# Patient Record
Sex: Male | Born: 1970 | Race: White | Hispanic: No | Smoking: Never smoker
Health system: Southern US, Community
[De-identification: ages and names within clinical notes are randomized; demographics above are authoritative.]

## PROBLEM LIST (undated history)

## (undated) DIAGNOSIS — M109 Gout, unspecified: Secondary | ICD-10-CM

## (undated) DIAGNOSIS — F419 Anxiety disorder, unspecified: Secondary | ICD-10-CM

## (undated) DIAGNOSIS — R7303 Prediabetes: Secondary | ICD-10-CM

## (undated) DIAGNOSIS — I10 Essential (primary) hypertension: Secondary | ICD-10-CM

## (undated) DIAGNOSIS — E790 Hyperuricemia without signs of inflammatory arthritis and tophaceous disease: Secondary | ICD-10-CM

## (undated) HISTORY — DX: Prediabetes: R73.03

## (undated) HISTORY — DX: Essential (primary) hypertension: I10

## (undated) HISTORY — PX: KNEE ARTHROCENTESIS: SUR44

---
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
## 2017-04-20 ENCOUNTER — Encounter: Payer: Self-pay | Admitting: Emergency Medicine

## 2017-04-20 ENCOUNTER — Emergency Department
Admission: EM | Admit: 2017-04-20 | Discharge: 2017-04-20 | Disposition: A | Discharge status: Home / Self Care | Payer: Commercial Managed Care - PPO | Source: Home / Self Care | Attending: Family Medicine | Admitting: Family Medicine

## 2017-04-20 ENCOUNTER — Other Ambulatory Visit: Payer: Self-pay

## 2017-04-20 DIAGNOSIS — I10 Essential (primary) hypertension: Secondary | ICD-10-CM

## 2017-04-20 HISTORY — DX: Gout, unspecified: M10.9

## 2017-04-20 HISTORY — DX: Anxiety disorder, unspecified: F41.9

## 2017-04-20 LAB — POCT CBC W AUTO DIFF (K'VILLE URGENT CARE)

## 2017-04-20 MED ORDER — LISINOPRIL 10 MG PO TABS: 10.0000 mg | ORAL_TABLET | Freq: Every day | ORAL | 0 refills | Status: DC

## 2017-04-20 NOTE — ED Triage Notes (Signed)
Patient had GI infection 2 days ago with nausea, vomiting and diarrhea; he was seen in an Urgent Care and at that visit was told his blood pressure was high; given xanax. Today at work BP was also high.

## 2017-04-20 NOTE — ED Provider Notes (Signed)
[facility]    CSN: [id number] Arrival date & time: 04/20/17  1718     History   Chief Complaint Chief Complaint  Patient presents with  . Hypertension    HPI Leon Henson is a 47 y.o. male.   HPI Leon Henson is a 47 y.o. male presenting to UC with concern for elevated blood pressure since having GI symptoms of n/v/d 2 days ago.  He was seen at a different urgent care at that time and was advised his BP was very high- 160/110.  He does have a hx of anxiety and was given Xanax and pt and provider were unable to determine if elevated BP was new or due to anxiety.  His GI symptoms have resolved so he did not take his xanax today and went to work. While at work, he reported his new prescription of Xanax to on-site nurse who also checked his BP which was 160/100.  Was advised to be evaluated for his elevated blood pressure. Denies HA, chest pain, dizziness, change in vision. No prior hx of HTN but he also does not have a PCP and is unsure of his last annual physical. Hx of HTN in his family.    Past Medical History:  Diagnosis Date  . Anxiety   . Gout     There are no active problems to display for this patient.   Past Surgical History:  Procedure Laterality Date  . KNEE ARTHROCENTESIS Left        Home Medications    Prior to Admission medications   Medication Sig Start Date End Date Taking? Authorizing Provider  citalopram (CELEXA) 10 MG tablet Take 10 mg by mouth daily.   Yes [provider]  febuxostat (ULORIC) 40 MG tablet Take 80 mg by mouth daily.   Yes [provider]  lisinopril (PRINIVIL,ZESTRIL) 10 MG tablet Take 1 tablet (10 mg total) by mouth daily. 04/20/17   Lurene Shadow, PA-C    Family History Family History  Problem Relation Age of Onset  . Heart failure Mother   . Hypertension Father     Social History Social History   Tobacco Use  . Smoking status: Never Smoker  . Smokeless tobacco: Never Used  Substance Use Topics   . Alcohol use: Yes  . Drug use: Not on file     Allergies   Patient has no known allergies.   Review of Systems Review of Systems  Constitutional: Negative for chills and fever.  Respiratory: Negative for cough and shortness of breath.   Cardiovascular: Negative for chest pain and palpitations.  Gastrointestinal: Negative for abdominal pain, diarrhea, nausea and vomiting.  Skin: Negative for rash.  Neurological: Negative for dizziness, light-headedness and headaches.     Physical Exam Triage Vital Signs ED Triage Vitals  Enc Vitals Group     BP --      Pulse Rate 04/20/17 1746 95     Resp 04/20/17 1746 16     Temp 04/20/17 1746 97.9 F (36.6 C)     Temp Source 04/20/17 1746 Oral     SpO2 04/20/17 1746 96 %     Weight 04/20/17 1750 246 lb (111.6 kg)     Height 04/20/17 1750 6' (1.829 m)     Head Circumference --      Peak Flow --      Pain Score --      Pain Loc --      Pain Edu? --  Excl. in GC? --    No data found.  Updated Vital Signs Pulse 95   Temp 97.9 F (36.6 C) (Oral)   Resp 16   Ht 6' (1.829 m)   Wt 246 lb (111.6 kg)   SpO2 96%   BMI 33.36 kg/m   Visual Acuity Right Eye Distance:   Left Eye Distance:   Bilateral Distance:    Right Eye Near:   Left Eye Near:    Bilateral Near:     Physical Exam  Constitutional: He is oriented to person, place, and time. He appears well-developed and well-nourished. No distress.  HENT:  Head: Normocephalic and atraumatic.  Mouth/Throat: Oropharynx is clear and moist.  Eyes: Conjunctivae and EOM are normal. Pupils are equal, round, and reactive to light. No scleral icterus.  Neck: Normal range of motion. Neck supple.  Cardiovascular: Normal rate and regular rhythm.  Pulmonary/Chest: Effort normal and breath sounds normal. No stridor. No respiratory distress. He has no wheezes. He has no rales.  Musculoskeletal: Normal range of motion.  Neurological: He is alert and oriented to person, place, and  time. No cranial nerve deficit.  Skin: Skin is warm and dry. He is not diaphoretic.  Psychiatric: He has a normal mood and affect. His behavior is normal.  Nursing note and vitals reviewed.    UC Treatments / Results  Labs (all labs ordered are listed, but only abnormal results are displayed) Labs Reviewed  COMPLETE METABOLIC PANEL WITH GFR - Abnormal; Notable for the following components:      Result Value   Glucose, Bld 113 (*)    AST 67 (*)    ALT 67 (*)    All other components within normal limits  POCT CBC W AUTO DIFF ([facility])    EKG  EKG Interpretation None       Radiology No results found.  Procedures Procedures (including critical care time)  Medications Ordered in UC Medications - No data to display   Initial Impression / Assessment and Plan / UC Course  I have reviewed the triage vital signs and the nursing notes.  Pertinent labs & imaging results that were available during my care of the patient were reviewed by me and considered in my medical decision making (see chart for details).     Asymptomatic HTN Pt does not have a PCP but wanting to establish care as soon as he can. Will start on lisinopril 10mg  and get CBC and CMP Resource guide for PCP next door provided  Discussed symptoms that warrant emergent care in the ED.   Final Clinical Impressions(s) / UC Diagnoses   Final diagnoses:  Hypertension, unspecified type    ED Discharge Orders        Ordered    lisinopril (PRINIVIL,ZESTRIL) 10 MG tablet  Daily     04/20/17 1757       Controlled Substance Prescriptions [place] Controlled Substance Registry consulted? Not Applicable   Rolla Platehelps, Kalil Woessner O, PA-C 04/22/17 [id number]

## 2017-04-21 ENCOUNTER — Telehealth: Payer: Self-pay | Admitting: Emergency Medicine

## 2017-04-21 LAB — COMPLETE METABOLIC PANEL WITH GFR
AG Ratio: 1.6 (calc) (ref 1.0–2.5)
ALT: 67 U/L — ABNORMAL HIGH (ref 9–46)
AST: 67 U/L — ABNORMAL HIGH (ref 10–40)
Albumin: 4.6 g/dL (ref 3.6–5.1)
Alkaline phosphatase (APISO): 64 U/L (ref 40–115)
BUN: 15 mg/dL (ref 7–25)
CO2: 28 mmol/L (ref 20–32)
Calcium: 9.9 mg/dL (ref 8.6–10.3)
Chloride: 98 mmol/L (ref 98–110)
Creat: 1.18 mg/dL (ref 0.60–1.35)
GFR, Est African American: 85 mL/min/{1.73_m2} (ref >=60)
GFR, Est Non African American: 73 mL/min/{1.73_m2} (ref >=60)
Globulin: 2.8 g/dL (calc) (ref 1.9–3.7)
Glucose, Bld: 113 mg/dL — ABNORMAL HIGH (ref 65–99)
Potassium: 4.3 mmol/L (ref 3.5–5.3)
Sodium: 137 mmol/L (ref 135–146)
Total Bilirubin: 0.8 mg/dL (ref 0.2–1.2)
Total Protein: 7.4 g/dL (ref 6.1–8.1)

## 2017-04-21 NOTE — Telephone Encounter (Signed)
Unable to leave a message.

## 2017-06-09 ENCOUNTER — Encounter (INDEPENDENT_AMBULATORY_CARE_PROVIDER_SITE_OTHER): Payer: Self-pay

## 2017-06-09 ENCOUNTER — Encounter: Payer: Self-pay | Admitting: Physician Assistant

## 2017-06-09 ENCOUNTER — Ambulatory Visit: Payer: Commercial Managed Care - PPO | Admitting: Physician Assistant

## 2017-06-09 VITALS — BP 174/119 | HR 80 | Wt 246.0 lb

## 2017-06-09 DIAGNOSIS — R740 Nonspecific elevation of levels of transaminase and lactic acid dehydrogenase [LDH]: Secondary | ICD-10-CM

## 2017-06-09 DIAGNOSIS — Z8249 Family history of ischemic heart disease and other diseases of the circulatory system: Secondary | ICD-10-CM | POA: Diagnosis not present

## 2017-06-09 DIAGNOSIS — F411 Generalized anxiety disorder: Secondary | ICD-10-CM

## 2017-06-09 DIAGNOSIS — Z7689 Persons encountering health services in other specified circumstances: Secondary | ICD-10-CM

## 2017-06-09 DIAGNOSIS — E781 Pure hyperglyceridemia: Secondary | ICD-10-CM | POA: Diagnosis not present

## 2017-06-09 DIAGNOSIS — R7401 Elevation of levels of liver transaminase levels: Secondary | ICD-10-CM

## 2017-06-09 DIAGNOSIS — Z8739 Personal history of other diseases of the musculoskeletal system and connective tissue: Secondary | ICD-10-CM | POA: Diagnosis not present

## 2017-06-09 DIAGNOSIS — Z13220 Encounter for screening for lipoid disorders: Secondary | ICD-10-CM | POA: Diagnosis not present

## 2017-06-09 DIAGNOSIS — I10 Essential (primary) hypertension: Secondary | ICD-10-CM | POA: Diagnosis not present

## 2017-06-09 MED ORDER — LISINOPRIL-HYDROCHLOROTHIAZIDE 20-25 MG PO TABS: 1.0000 | ORAL_TABLET | Freq: Every day | ORAL | 5 refills | Status: DC

## 2017-06-09 NOTE — Patient Instructions (Addendum)
For your blood pressure: - Goal <130/80 - monitor and log blood pressures at home - check around the same time each day in a relaxed setting - Limit salt to <2000 mg/day - Follow DASH eating plan - limit alcohol to 2 standard drinks per day for men and 1 per day for women - avoid tobacco products - weight loss: 7% of current body weight - return in 2 weeks for nurse BP check - follow-up at least every 6 months for your blood pressure    Nonalcoholic Fatty Liver Disease Diet Nonalcoholic fatty liver disease is a condition that causes fat to accumulate in and around the liver. The disease makes it harder for the liver to work the way that it should. Following a healthy diet can help to keep nonalcoholic fatty liver disease under control. It can also help to prevent or improve conditions that are associated with the disease, such as heart disease, diabetes, high blood pressure, and abnormal cholesterol levels. Along with regular exercise, this diet:  Promotes weight loss.  Helps to control blood sugar levels.  Helps to improve the way that the body uses insulin.  What do I need to know about this diet?  Use the glycemic index (GI) to plan your meals. The index tells you how quickly a food will raise your blood sugar. Choose low-GI foods. These foods take a longer time to raise blood sugar.  Keep track of how many calories you take in. Eating the right amount of calories will help you to achieve a healthy weight.  You may want to follow a Mediterranean diet. This diet includes a lot of vegetables, lean meats or fish, whole grains, fruits, and healthy oils and fats. What foods can I eat? Grains Whole grains, such as whole-wheat or whole-grain breads, crackers, tortillas, cereals, and pasta. Stone-ground whole wheat. Pumpernickel bread. Unsweetened oatmeal. Bulgur. Barley. Quinoa. Brown or wild rice. Corn or whole-wheat flour tortillas. Vegetables Lettuce. Spinach. Peas. Beets.  Cauliflower. Cabbage. Broccoli. Carrots. Tomatoes. Squash. Eggplant. Herbs. Peppers. Onions. Cucumbers. Brussels sprouts. Yams and sweet potatoes. Beans. Lentils. Fruits Bananas. Apples. Oranges. Grapes. Papaya. Mango. Pomegranate. Kiwi. Grapefruit. Cherries. Meats and Other Protein Sources Seafood and shellfish. Lean meats. Poultry. Tofu. Dairy Low-fat or fat-free dairy products, such as yogurt, cottage cheese, and cheese. Beverages Water. Sugar-free drinks. Tea. Coffee. Low-fat or skim milk. Milk alternatives, such as soy or almond milk. Real fruit juice. Condiments Mustard. Relish. Low-fat, low-sugar ketchup and barbecue sauce. Low-fat or fat-free mayonnaise. Sweets and Desserts Sugar-free sweets. Fats and Oils Avocado. Canola or olive oil. Nuts and nut butters. Seeds. The items listed above may not be a complete list of recommended foods or beverages. Contact your dietitian for more options. What foods are not recommended? Palm oil and coconut oil. Processed foods. Fried foods. Sweetened drinks, such as sweet tea, milkshakes, snow cones, iced sweet drinks, and sodas. Alcohol. Sweets. Foods that contain a lot of salt or sodium. The items listed above may not be a complete list of foods and beverages to avoid. Contact your dietitian for more information. This information is not intended to replace advice given to you by your health care provider. Make sure you discuss any questions you have with your health care provider. Document Released: 08/13/2014 Document Revised: 09/04/2015 Document Reviewed: 04/23/2014 Elsevier Interactive Patient Education  [employer]2018 Elsevier Inc.

## 2017-06-09 NOTE — Progress Notes (Signed)
HPI:                                                                Leon Henson is a 47 y.o. male [facility] today to establish care  Current concerns include: medication refills  Anxiety/GAD: taking Celexa 10 mg daily for years. Compliant with medications. Reports he has tried to taper off of Celexa in the past and had horrible withdrawal symptoms. Reports panic attack in January and was seen in urgent care. Diagnosed with HTN at that time.   HTN: taking Lisinopril 10 mg daily. Compliant with medications. Does not monitor BP's at home. Denies vision change, headache, chest pain with exertion, orthopnea, lightheadedness, syncope and edema. Risk factors include: male sex, family hx  Gout: taking Uloric daily. Reports 2-3 flares per year affecting various joints.  [facility] screen [facility] 2/9 06/09/2017  Decreased Interest 0  Down, Depressed, Hopeless 0  PHQ - 2 Score 0    No flowsheet data found.    Past Medical History:  Diagnosis Date  . Anxiety   . Gout   . Hypertension    Past Surgical History:  Procedure Laterality Date  . KNEE ARTHROCENTESIS Left    Social History   Tobacco Use  . Smoking status: Never Smoker  . Smokeless tobacco: Never Used  Substance Use Topics  . Alcohol use: Yes    Alcohol/week: 15.0 oz    Types: 25 Cans of beer per week   family history includes Heart attack in his mother; Heart failure in his mother; Hypertension in his father.    ROS: Review of Systems  Psychiatric/Behavioral: The patient is nervous/anxious.   All other systems reviewed and are negative.    Medications: Current Outpatient Medications  Medication Sig Dispense Refill  . citalopram (CELEXA) 10 MG tablet Take 10 mg by mouth daily.    Bess Harvest. Icosapent Ethyl 1 g CAPS Take 2 capsules (2 g total) by mouth 2 (two) times daily. 120 capsule 5  . lisinopril-hydrochlorothiazide (PRINZIDE,ZESTORETIC) 20-25 MG tablet  Take 1 tablet by mouth daily. 30 tablet 5  . ULORIC 80 MG TABS Take 1 tablet by mouth daily.  3   No current facility-administered medications for this visit.    No Known Allergies     Objective:  BP (!) 174/119   Pulse 80   Wt 246 lb (111.6 kg)   BMI 33.36 kg/m  Gen:  alert, not ill-appearing, no distress, appropriate for age, obese male HEENT: head normocephalic without obvious abnormality, conjunctiva and cornea clear, trachea midline Pulm: Normal work of breathing, normal phonation, clear to auscultation bilaterally, no wheezes, rales or rhonchi CV: Normal rate, regular rhythm, s1 and s2 distinct, no murmurs, clicks or rubs  Neuro: alert and oriented x 3, no tremor MSK: extremities atraumatic, normal gait and station, no peripheral edema Skin: intact, no rashes on exposed skin, no jaundice, no cyanosis Psych: well-groomed, cooperative, good eye contact, euthymic mood, affect mood-congruent, speech is articulate, and thought processes clear and goal-directed    No results found for this or any previous visit (from the past 72 hour(s)). No results found.    Assessment and Plan: 47 y.o. male with   Encounter to establish care  GAD (  generalized anxiety disorder)  Uncontrolled stage 2 hypertension - Plan: lisinopril-hydrochlorothiazide (PRINZIDE,ZESTORETIC) 20-25 MG tablet, Comprehensive metabolic panel  Transaminitis - Plan: Comprehensive metabolic panel  Family history of heart attack  Screening for lipid disorders - Plan: Lipid Panel w/reflex Direct LDL, DIRECT LDL  Hypertriglyceridemia - Plan: Icosapent Ethyl 1 g CAPS  History of gout - Plan: ULORIC 80 MG TABS   Patient education and anticipatory guidance given Patient agrees with treatment plan Follow-up in 2 weeks for nurse BP check, then Q31months for medication management or sooner as needed if symptoms worsen or fail to improve  Levonne Hubert PA-C

## 2017-06-10 LAB — COMPREHENSIVE METABOLIC PANEL
AG RATIO: 1.5 (calc) (ref 1.0–2.5)
ALT: 54 U/L — AB (ref 9–46)
AST: 54 U/L — ABNORMAL HIGH (ref 10–40)
Albumin: 4.6 g/dL (ref 3.6–5.1)
Alkaline phosphatase (APISO): 62 U/L (ref 40–115)
BUN: 19 mg/dL (ref 7–25)
CO2: 26 mmol/L (ref 20–32)
CREATININE: 1.05 mg/dL (ref 0.60–1.35)
Calcium: 9.8 mg/dL (ref 8.6–10.3)
Chloride: 103 mmol/L (ref 98–110)
GLOBULIN: 3 g/dL (ref 1.9–3.7)
GLUCOSE: 106 mg/dL (ref 65–139)
Potassium: 4.1 mmol/L (ref 3.5–5.3)
SODIUM: 142 mmol/L (ref 135–146)
TOTAL PROTEIN: 7.6 g/dL (ref 6.1–8.1)
Total Bilirubin: 0.7 mg/dL (ref 0.2–1.2)

## 2017-06-10 LAB — LIPID PANEL W/REFLEX DIRECT LDL
CHOL/HDL RATIO: 7.1 (calc) — AB (ref <5.0)
Cholesterol: 270 mg/dL — ABNORMAL HIGH (ref <200)
HDL: 38 mg/dL — ABNORMAL LOW (ref >40)
NON-HDL CHOLESTEROL (CALC): 232 mg/dL — AB (ref <130)
TRIGLYCERIDES: 758 mg/dL — AB (ref <150)

## 2017-06-10 LAB — DIRECT LDL: LDL DIRECT: 107 mg/dL — AB (ref <100)

## 2017-06-10 MED ORDER — ICOSAPENT ETHYL 1 G PO CAPS: 2.0000 | ORAL_CAPSULE | Freq: Two times a day (BID) | ORAL | 5 refills | Status: DC

## 2017-06-10 NOTE — Progress Notes (Signed)
Liver enzymes are improved. They are still a little elevated, but reassuring that they are trending down. Recommend we recheck liver function in 6 months Triglycerides are extremely high, high enough to increase risk of pancreatitis. I am going to send in a prescription for Vascepa, which will lower this type of cholesterol. He should take this medication in addition to a low-fat diet

## 2017-06-20 ENCOUNTER — Encounter: Payer: Self-pay | Admitting: Physician Assistant

## 2017-06-20 DIAGNOSIS — E781 Pure hyperglyceridemia: Secondary | ICD-10-CM | POA: Insufficient documentation

## 2017-06-20 DIAGNOSIS — Z8739 Personal history of other diseases of the musculoskeletal system and connective tissue: Secondary | ICD-10-CM | POA: Insufficient documentation

## 2017-06-24 ENCOUNTER — Ambulatory Visit: Payer: Commercial Managed Care - PPO

## 2017-07-01 ENCOUNTER — Encounter: Payer: Self-pay | Admitting: Physician Assistant

## 2017-07-01 ENCOUNTER — Ambulatory Visit (INDEPENDENT_AMBULATORY_CARE_PROVIDER_SITE_OTHER): Payer: Commercial Managed Care - PPO | Admitting: Physician Assistant

## 2017-07-01 VITALS — BP 166/96 | HR 95 | Wt 242.0 lb

## 2017-07-01 DIAGNOSIS — R740 Nonspecific elevation of levels of transaminase and lactic acid dehydrogenase [LDH]: Secondary | ICD-10-CM | POA: Diagnosis not present

## 2017-07-01 DIAGNOSIS — E6609 Other obesity due to excess calories: Secondary | ICD-10-CM

## 2017-07-01 DIAGNOSIS — E781 Pure hyperglyceridemia: Secondary | ICD-10-CM

## 2017-07-01 DIAGNOSIS — I10 Essential (primary) hypertension: Secondary | ICD-10-CM

## 2017-07-01 DIAGNOSIS — R7401 Elevation of levels of liver transaminase levels: Secondary | ICD-10-CM

## 2017-07-01 MED ORDER — LISINOPRIL-HYDROCHLOROTHIAZIDE 20-25 MG PO TABS: 1.0000 | ORAL_TABLET | Freq: Every day | ORAL | 1 refills | Status: DC

## 2017-07-01 NOTE — Patient Instructions (Signed)

## 2017-07-01 NOTE — Progress Notes (Signed)
HPI:                                                                Leon Henson is a 47 y.o. male who presents to [facility]: Primary [facility] Medicine today for hypertension follow-up  HTN: taking Lisinopril-HCZT 20-25 daily, which was increased 1 month ago from Lisinopril 10 mg. Compliant with medications. Checks BP's at home. BP averages 130's/80's, but reports occasional readings as high as 150/100, usually after work. Denies vision change, headache, chest pain with exertion, orthopnea, lightheadedness, syncope and edema. Risk factors include: male sex, obesity, HLD, family hx  Hypertriglyceridemia: did not fill Vascepa. States he is trying to manage "the old fashioned way." Denies abdominal pain, nausea, vomiting.  Obesity: increased his physical activity, walking most days. Eating healthier diet.    Depression screen [facility] 2/9 06/09/2017  Decreased Interest 0  Down, Depressed, Hopeless 0  PHQ - 2 Score 0    No flowsheet data found.    Past Medical History:  Diagnosis Date  . Anxiety   . Gout   . Hypertension    Past Surgical History:  Procedure Laterality Date  . KNEE ARTHROCENTESIS Left    Social History   Tobacco Use  . Smoking status: Never Smoker  . Smokeless tobacco: Never Used  Substance Use Topics  . Alcohol use: Yes    Alcohol/week: 15.0 oz    Types: 25 Cans of beer per week   family history includes Heart attack in his mother; Heart failure in his mother; Hypertension in his father.    ROS: negative except as noted in the HPI  Medications: Current Outpatient Medications  Medication Sig Dispense Refill  . citalopram (CELEXA) 10 MG tablet Take 10 mg by mouth daily.    Bess Harvest Ethyl 1 g CAPS Take 2 capsules (2 g total) by mouth 2 (two) times daily. 120 capsule 5  . lisinopril-hydrochlorothiazide (PRINZIDE,ZESTORETIC) 20-25 MG tablet Take 1 tablet by mouth daily. 30 tablet 5  . ULORIC 80 MG TABS Take 1 tablet by mouth daily.   3   No current facility-administered medications for this visit.    No Known Allergies     Objective:  BP (!) 166/96   Pulse 95   Wt 242 lb (109.8 kg)   BMI 32.82 kg/m  Gen:  alert, not ill-appearing, no distress, appropriate for age, obese male HEENT: head normocephalic without obvious abnormality, conjunctiva and cornea clear, trachea midline Pulm: Normal work of breathing, normal phonation, clear to auscultation bilaterally, no wheezes, rales or rhonchi CV: Normal rate, regular rhythm, s1 and s2 distinct, no murmurs, clicks or rubs; radial pulses 2+ symmetric  Neuro: alert and oriented x 3, no tremor MSK: extremities atraumatic, normal gait and station, no peripheral edema Skin: intact, no rashes on exposed skin, no jaundice, no cyanosis Psych: well-groomed, cooperative, good eye contact, euthymic mood, flat affect poverty of speech, and thought processes clear and goal-directed    No results found for this or any previous visit (from the past 72 hour(s)). No results found.    Assessment and Plan: 46 y.o. male with   1. Hypertension goal BP (blood pressure) < 130/80 BP Readings from Last 3 Encounters:  07/01/17 (!) 166/96  06/09/17 (!) 174/119  -  home BP's are almost at goal and he is actively losing weight. He has white coat syndrome. Continue therapeutic lifestyle changes and Prinzide. Follow-up in 3 months. If BP still not at goal, will add 3rd medication. - lisinopril-hydrochlorothiazide (PRINZIDE,ZESTORETIC) 20-25 MG tablet; Take 1 tablet by mouth daily.  Dispense: 90 tablet; Refill: 1  2. Hypertriglyceridemia Lab Results  Component Value Date   CHOL 270 (H) 06/09/2017   HDL 38 (L) 06/09/2017   LDLCALC  06/09/2017     Comment:     . LDL cholesterol not calculated. Triglyceride levels greater than 400 mg/dL invalidate calculated LDL results. . Reference range: <100 . Desirable range <100 mg/dL for primary prevention;   <70 mg/dL for patients with CHD or  diabetic patients  with > or = 2 CHD risk factors. Marland Kitchen. LDL-C is now calculated using the Martin-Hopkins  calculation, which is a validated novel method providing  better accuracy than the Friedewald equation in the  estimation of LDL-C.  Horald PollenMartin SS et al. Lenox AhrJAMA. 1610;960(452013;310(19): 2061-2068  (http://education.[facility].com/faq/FAQ164)    LDLDIRECT 107 (H) 06/09/2017   TRIG 758 (H) 06/09/2017   CHOLHDL 7.1 (H) 06/09/2017  - explained risk of pancreatitis with TG>500 and strongly recommended filling the Vascepa in addition to diet and lifestyle - recheck fasting lipids in 3 months  3. Transaminitis Lab Results  Component Value Date   ALT 54 (H) 06/09/2017   AST 54 (H) 06/09/2017   BILITOT 0.7 06/09/2017  - stable, recheck in 3 months  4. Class 1 Obesity Wt Readings from Last 3 Encounters:  07/01/17 242 lb (109.8 kg)  06/09/17 246 lb (111.6 kg)  04/20/17 246 lb (111.6 kg)  - has lost 4 pounds and doing well - counseled to continue weight loss through decreased caloric intake and increased aerobic exercise   Patient education and anticipatory guidance given Patient agrees with treatment plan Follow-up in 3 months for fasting labs and HTN or sooner as needed if symptoms worsen or fail to improve  Levonne Hubertharley E. Fisher Hargadon PA-C

## 2017-08-05 ENCOUNTER — Ambulatory Visit: Payer: Commercial Managed Care - PPO | Admitting: Physician Assistant

## 2017-09-30 ENCOUNTER — Encounter: Payer: Self-pay | Admitting: Physician Assistant

## 2017-09-30 ENCOUNTER — Ambulatory Visit: Payer: Commercial Managed Care - PPO | Admitting: Physician Assistant

## 2017-09-30 VITALS — BP 165/93 | HR 87 | Wt 237.0 lb

## 2017-09-30 DIAGNOSIS — Z79899 Other long term (current) drug therapy: Secondary | ICD-10-CM

## 2017-09-30 DIAGNOSIS — R7401 Elevation of levels of liver transaminase levels: Secondary | ICD-10-CM

## 2017-09-30 DIAGNOSIS — E781 Pure hyperglyceridemia: Secondary | ICD-10-CM | POA: Diagnosis not present

## 2017-09-30 DIAGNOSIS — I10 Essential (primary) hypertension: Secondary | ICD-10-CM | POA: Diagnosis not present

## 2017-09-30 DIAGNOSIS — R740 Nonspecific elevation of levels of transaminase and lactic acid dehydrogenase [LDH]: Secondary | ICD-10-CM

## 2017-09-30 DIAGNOSIS — Z8739 Personal history of other diseases of the musculoskeletal system and connective tissue: Secondary | ICD-10-CM

## 2017-09-30 DIAGNOSIS — F411 Generalized anxiety disorder: Secondary | ICD-10-CM | POA: Diagnosis not present

## 2017-09-30 MED ORDER — AMLODIPINE BESYLATE 10 MG PO TABS: 10.0000 mg | ORAL_TABLET | Freq: Every day | ORAL | 1 refills | Status: DC

## 2017-09-30 NOTE — Patient Instructions (Addendum)
For your blood pressure: - Goal <130/80 - start Amlodipine 10 mg daily - monitor and log blood pressures at home - check around the same time each day in a relaxed setting - Limit salt to <2000 mg/day - Follow DASH eating plan - limit alcohol to 2 standard drinks per day for men and 1 per day for women - avoid tobacco products - weight loss: 7% of current body weight - follow-up every 6 months for your blood pressure   For Gout - follow low-purine diet - the knuckle is not a common location for gout. Would recommend at your next flare, you come to the office and see the Sports Medicine doctor so they can aspirate the fluid from the joint and test it for presence of uric acid crystals  Low-Purine Diet Purines are compounds that affect the level of uric acid in your body. A low-purine diet is a diet that is low in purines. Eating a low-purine diet can prevent the level of uric acid in your body from getting too high and causing gout or kidney stones or both. What do I need to know about this diet?  Choose low-purine foods. Examples of low-purine foods are listed in the next section.  Drink plenty of fluids, especially water. Fluids can help remove uric acid from your body. Try to drink 8-16 cups (1.9-3.8 L) a day.  Limit foods high in fat, especially saturated fat, as fat makes it harder for the body to get rid of uric acid. Foods high in saturated fat include pizza, cheese, ice cream, whole milk, fried foods, and gravies. Choose foods that are lower in fat and lean sources of protein. Use olive oil when cooking as it contains healthy fats that are not high in saturated fat.  Limit alcohol. Alcohol interferes with the elimination of uric acid from your body. If you are having a gout attack, avoid all alcohol.  Keep in mind that different people's bodies react differently to different foods. You will probably learn over time which foods do or do not affect you. If you discover that a food  tends to cause your gout to flare up, avoid eating that food. You can more freely enjoy foods that do not cause problems. If you have any questions about a food item, talk to your dietitian or health care provider. Which foods are low, moderate, and high in purines? The following is a list of foods that are low, moderate, and high in purines. You can eat any amount of the foods that are low in purines. You may be able to have small amounts of foods that are moderate in purines. Ask your health care provider how much of a food moderate in purines you can have. Avoid foods high in purines. Grains  Foods low in purines: Enriched white bread, pasta, rice, cake, cornbread, popcorn.  Foods moderate in purines: Whole-grain breads and cereals, wheat germ, bran, oatmeal. Uncooked oatmeal. Dry wheat bran or wheat germ.  Foods high in purines: Pancakes, [place] toast, biscuits, muffins. Vegetables  Foods low in purines: All vegetables, except those that are moderate in purines.  Foods moderate in purines: Asparagus, cauliflower, spinach, mushrooms, green peas. Fruits  All fruits are low in purines. Meats and other Protein Foods  Foods low in purines: Eggs, nuts, peanut butter.  Foods moderate in purines: 80-90% lean beef, lamb, veal, pork, poultry, fish, eggs, peanut butter, nuts. Crab, lobster, oysters, and shrimp. Cooked dried beans, peas, and lentils.  Foods high in  purines: Anchovies, sardines, herring, mussels, tuna, codfish, scallops, trout, and haddock. Tomasa BlaseBacon. Organ meats (such as liver or kidney). Tripe. Game meat. Goose. Sweetbreads. Dairy  All dairy foods are low in purines. Low-fat and fat-free dairy products are best because they are low in saturated fat. Beverages  Drinks low in purines: Water, carbonated beverages, tea, coffee, cocoa.  Drinks moderate in purines: Soft drinks and other drinks sweetened with high-fructose corn syrup. Juices. To find whether a food or drink is  sweetened with high-fructose corn syrup, look at the ingredients list.  Drinks high in purines: Alcoholic beverages (such as beer). Condiments  Foods low in purines: Salt, herbs, olives, pickles, relishes, vinegar.  Foods moderate in purines: Butter, margarine, oils, mayonnaise. Fats and Oils  Foods low in purines: All types, except gravies and sauces made with meat.  Foods high in purines: Gravies and sauces made with meat. Other Foods  Foods low in purines: Sugars, sweets, gelatin. Cake. Soups made without meat.  Foods moderate in purines: Meat-based or fish-based soups, broths, or bouillons. Foods and drinks sweetened with high-fructose corn syrup.  Foods high in purines: High-fat desserts (such as ice cream, cookies, cakes, pies, doughnuts, and chocolate). Contact your dietitian for more information on foods that are not listed here. This information is not intended to replace advice given to you by your health care provider. Make sure you discuss any questions you have with your health care provider. Document Released: 07/24/2010 Document Revised: 09/04/2015 Document Reviewed: 03/05/2013 Elsevier Interactive Patient Education  2017 [employer]

## 2017-09-30 NOTE — Progress Notes (Signed)
HPI:                                                                Leon Henson is a 47 y.o. male who [facility] today for medication management  HTN: taking Lisinopril-HCTZ daily. Compliant with medications. Reports dry cough/tickle in the throat, erectile dysfunction and muscle cramps. Denies vision change, headache, chest pain with exertion, orthopnea, lightheadedness, syncope and edema. Risk factors include: male sex, family hx, obesity, HLD   Hypertriglyceridemia: never started Vascepa. Declines medication management.  Hx of gout: taking Uloric 4 days per week, does not take it on his 3 days off. Reports mild flares in his bilateral 5th MCP joints  Anxiety: taking Celexa 10 mg daily. Compliant with medication. Well-controlled. This is currently prescribed by his employer physician. He would like me to take it over at his next refill.  [facility] 09/30/2017 06/09/2017  Decreased Interest 0 0  Down, Depressed, Hopeless 0 0  PHQ - 2 Score 0 0  Altered sleeping 1 -  Tired, decreased energy 1 -  Change in appetite 0 -  Feeling bad or failure about yourself  0 -  Trouble concentrating 0 -  Moving slowly or fidgety/restless 0 -  Suicidal thoughts 0 -  PHQ-9 Score 2 -  Difficult doing work/chores Not difficult at all -    GAD 7 : Generalized Anxiety Score 09/30/2017  Nervous, Anxious, on Edge 1  Control/stop worrying 0  Worry too much - different things 0  Trouble relaxing 0  Restless 0  Easily annoyed or irritable 0  Afraid - awful might happen 0  Total GAD 7 Score 1  Anxiety Difficulty Not difficult at all      Past Medical History:  Diagnosis Date  . Anxiety   . Gout   . Hypertension    Past Surgical History:  Procedure Laterality Date  . KNEE ARTHROCENTESIS Left    Social History   Tobacco Use  . Smoking status: Never Smoker  . Smokeless tobacco: Never Used  Substance Use Topics  .  Alcohol use: Yes    Alcohol/week: 15.0 oz    Types: 25 Cans of beer per week   family history includes Heart attack in his mother; Heart failure in his mother; Hypertension in his father.    ROS: negative except as noted in the HPI  Medications: Current Outpatient Medications  Medication Sig Dispense Refill  . citalopram (CELEXA) 10 MG tablet Take 10 mg by mouth daily.    Marland Kitchen. ULORIC 80 MG TABS Take 1 tablet by mouth daily.  3  . amLODipine (NORVASC) 10 MG tablet Take 1 tablet (10 mg total) by mouth daily. 90 tablet 1   No current facility-administered medications for this visit.    Allergies  Allergen Reactions  . Hydrochlorothiazide Other (See Comments)    ED, muscle cramps, gout  . Lisinopril Cough       Objective:  BP (!) 165/93   Pulse 87   Wt 237 lb (107.5 kg)   BMI 32.14 kg/m  Gen:  alert, not ill-appearing, no distress, appropriate for age, obese male HEENT: head normocephalic without obvious abnormality, conjunctiva and cornea clear, trachea midline Pulm: Normal work of  breathing, normal phonation, clear to auscultation bilaterally, no wheezes, rales or rhonchi CV: Normal rate, regular rhythm, s1 and s2 distinct, no murmurs, clicks or rubs  Neuro: alert and oriented x 3, no tremor MSK: extremities atraumatic, normal gait and station, no peripheral edema Skin: intact, no rashes on exposed skin, no jaundice, no cyanosis Psych: well-groomed, cooperative, good eye contact, euthymic mood, flat affect, speech is articulate, and thought processes clear and goal-directed    Results for orders placed or performed in visit on 09/30/17 (from the past 72 hour(s))  Comprehensive metabolic panel     Status: Abnormal   Collection Time: 09/30/17 10:55 AM  Result Value Ref Range   Glucose, Bld 112 (H) 65 - 99 mg/dL    Comment: .            Fasting reference interval . For someone without known diabetes, a glucose value between 100 and 125 mg/dL is consistent  with prediabetes and should be confirmed with a follow-up test. .    BUN 15 7 - 25 mg/dL   Creat 1.61 0.96 - 0.45 mg/dL   BUN/Creatinine Ratio NOT APPLICABLE 6 - 22 (calc)   Sodium 141 135 - 146 mmol/L   Potassium 4.3 3.5 - 5.3 mmol/L   Chloride 101 98 - 110 mmol/L   CO2 30 20 - 32 mmol/L   Calcium 10.1 8.6 - 10.3 mg/dL   Total Protein 7.5 6.1 - 8.1 g/dL   Albumin 4.5 3.6 - 5.1 g/dL   Globulin 3.0 1.9 - 3.7 g/dL (calc)   AG Ratio 1.5 1.0 - 2.5 (calc)   Total Bilirubin 0.8 0.2 - 1.2 mg/dL   Alkaline phosphatase (APISO) 64 40 - 115 U/L   AST 43 (H) 10 - 40 U/L   ALT 44 9 - 46 U/L  Lipid Panel w/reflex Direct LDL     Status: Abnormal   Collection Time: 09/30/17 10:55 AM  Result Value Ref Range   Cholesterol 266 (H) <200 mg/dL   HDL 41 >40 mg/dL   Triglycerides 981 (H) <150 mg/dL   LDL Cholesterol (Calc)  mg/dL (calc)    Comment: . LDL cholesterol not calculated. Triglyceride levels greater than 400 mg/dL invalidate calculated LDL results. . Reference range: <100 . Desirable range <100 mg/dL for primary prevention;   <70 mg/dL for patients with CHD or diabetic patients  with > or = 2 CHD risk factors. Marland Kitchen LDL-C is now calculated using the Martin-Hopkins  calculation, which is a validated novel method providing  better accuracy than the Friedewald equation in the  estimation of LDL-C.  Horald Pollen et al. Lenox Ahr. 1914;782(95): 2061-2068  (http://education.[facility].com/faq/FAQ164)    Total CHOL/HDL Ratio 6.5 (H) <5.0 (calc)   Non-HDL Cholesterol (Calc) 225 (H) <130 mg/dL (calc)    Comment: Non-HDL level > or = 220 is very high and may indicate  genetic familial hypercholesterolemia (FH). Clinical  assessment and measurement of blood lipid levels  should be considered for all first-degree relatives  of patients with an FH diagnosis. . For patients with diabetes plus 1 major ASCVD risk  factor, treating to a non-HDL-C goal of <100 mg/dL  (LDL-C of <62 mg/dL) is  considered a therapeutic  option.    No results found.    Assessment and Plan: 47 y.o. male with   Encounter for medication management  Hypertension goal BP (blood pressure) < 130/80 - Plan: Comprehensive metabolic panel, amLODipine (NORVASC) 10 MG tablet  Transaminitis - Plan: Comprehensive metabolic panel  Hypertriglyceridemia -  Plan: Lipid Panel w/reflex Direct LDL  History of gout  GAD (generalized anxiety disorder)  HTN BP Readings from Last 3 Encounters:  09/30/17 (!) 165/93  07/01/17 (!) 166/96  06/09/17 (!) 174/119  - intolerance to ACE (cough) and thiazide (ED). Switching to Amlodipine 10 mg. He also has white coat HTN - counseled on therapeutic lifestyle changes - follow-up in 2 weeks for nurse BP check  Hypertriglyceridemia - last TG's 758, 3 months ago. Patient declined Vascepa. Counseled on risks and benefits - fasting lipids pending  Transaminitis - last AST 54, ALT 54, 3 months ago. Rechecking CMP today Counseled on low-fat diet, regular aerobic exercise, weight loss and abstaining from alcohol  History of gout - distribution in the 5th MCP is unusual. Possible OA or pseudo-gout. He is prescribed Uloric by another provider, but does not take it consistently. No uric acid levels. Recommend self-discontinue Uloric and follow-up with sports medicine for active flare if needed.   GAD - GAD7=1 - continue Celexa 10 mg daily  Patient education and anticipatory guidance given Patient agrees with treatment plan Follow-up as needed if symptoms worsen or fail to improve  Levonne Hubert PA-C

## 2017-10-01 LAB — COMPREHENSIVE METABOLIC PANEL
AG Ratio: 1.5 (calc) (ref 1.0–2.5)
ALBUMIN MSPROF: 4.5 g/dL (ref 3.6–5.1)
ALT: 44 U/L (ref 9–46)
AST: 43 U/L — ABNORMAL HIGH (ref 10–40)
Alkaline phosphatase (APISO): 64 U/L (ref 40–115)
BILIRUBIN TOTAL: 0.8 mg/dL (ref 0.2–1.2)
BUN: 15 mg/dL (ref 7–25)
CALCIUM: 10.1 mg/dL (ref 8.6–10.3)
CO2: 30 mmol/L (ref 20–32)
CREATININE: 1.06 mg/dL (ref 0.60–1.35)
Chloride: 101 mmol/L (ref 98–110)
Globulin: 3 g/dL (calc) (ref 1.9–3.7)
Glucose, Bld: 112 mg/dL — ABNORMAL HIGH (ref 65–99)
Potassium: 4.3 mmol/L (ref 3.5–5.3)
SODIUM: 141 mmol/L (ref 135–146)
Total Protein: 7.5 g/dL (ref 6.1–8.1)

## 2017-10-01 LAB — LIPID PANEL W/REFLEX DIRECT LDL
CHOL/HDL RATIO: 6.5 (calc) — AB (ref <5.0)
Cholesterol: 266 mg/dL — ABNORMAL HIGH (ref <200)
HDL: 41 mg/dL (ref >40)
Non-HDL Cholesterol (Calc): 225 mg/dL (calc) — ABNORMAL HIGH (ref <130)
TRIGLYCERIDES: 431 mg/dL — AB (ref <150)

## 2017-10-01 LAB — DIRECT LDL: LDL DIRECT: 142 mg/dL — AB (ref <100)

## 2017-10-05 ENCOUNTER — Encounter: Payer: Self-pay | Admitting: Physician Assistant

## 2017-10-05 DIAGNOSIS — R7301 Impaired fasting glucose: Secondary | ICD-10-CM | POA: Insufficient documentation

## 2017-10-05 DIAGNOSIS — E782 Mixed hyperlipidemia: Secondary | ICD-10-CM | POA: Insufficient documentation

## 2017-10-05 NOTE — Progress Notes (Signed)
Liver enzymes are improved.  Total and LDL cholesterol are high. This increases risk of heart attack. I would recommend cholesterol lowering medication in addition to Mediterranean diet, regular aerobic exercise and losing 5% of body weight. Blood sugar is a little high. Recommend checking an A1c at next office visit to rule out prediabetes

## 2018-02-28 ENCOUNTER — Encounter: Payer: Self-pay | Admitting: Physician Assistant

## 2018-02-28 ENCOUNTER — Ambulatory Visit (INDEPENDENT_AMBULATORY_CARE_PROVIDER_SITE_OTHER): Payer: Commercial Managed Care - PPO | Admitting: Physician Assistant

## 2018-02-28 VITALS — BP 137/76 | HR 87 | Wt 245.0 lb

## 2018-02-28 DIAGNOSIS — G4734 Idiopathic sleep related nonobstructive alveolar hypoventilation: Secondary | ICD-10-CM

## 2018-02-28 DIAGNOSIS — E6609 Other obesity due to excess calories: Secondary | ICD-10-CM

## 2018-02-28 DIAGNOSIS — R6882 Decreased libido: Secondary | ICD-10-CM | POA: Diagnosis not present

## 2018-02-28 DIAGNOSIS — R5383 Other fatigue: Secondary | ICD-10-CM | POA: Diagnosis not present

## 2018-02-28 DIAGNOSIS — E782 Mixed hyperlipidemia: Secondary | ICD-10-CM

## 2018-02-28 DIAGNOSIS — I10 Essential (primary) hypertension: Secondary | ICD-10-CM | POA: Diagnosis not present

## 2018-02-28 DIAGNOSIS — N529 Male erectile dysfunction, unspecified: Secondary | ICD-10-CM

## 2018-02-28 DIAGNOSIS — R0683 Snoring: Secondary | ICD-10-CM

## 2018-02-28 DIAGNOSIS — G4710 Hypersomnia, unspecified: Secondary | ICD-10-CM

## 2018-02-28 DIAGNOSIS — R7301 Impaired fasting glucose: Secondary | ICD-10-CM

## 2018-02-28 DIAGNOSIS — L905 Scar conditions and fibrosis of skin: Secondary | ICD-10-CM

## 2018-02-28 MED ORDER — SILDENAFIL CITRATE 20 MG PO TABS: ORAL_TABLET | ORAL | 11 refills | Status: DC

## 2018-02-28 MED ORDER — AMLODIPINE BESYLATE-VALSARTAN 10-160 MG PO TABS: 1.0000 | ORAL_TABLET | Freq: Every day | ORAL | 1 refills | Status: DC

## 2018-02-28 MED ORDER — ATORVASTATIN CALCIUM 20 MG PO TABS: 20.0000 mg | ORAL_TABLET | Freq: Every day | ORAL | 3 refills | Status: DC
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
## 2018-02-28 NOTE — Patient Instructions (Addendum)
Return for fasting 8 am labs  For your blood pressure: - Goal <130/80 (Ideally 120's/70's) - take your blood pressure medication in the morning (unless instructed differently) - take baby aspirin 81 mg daily to help prevent heart attack/stroke - monitor and log blood pressures at home - check around the same time each day in a relaxed setting - Limit salt to <2500 mg/day - Follow DASH (Dietary Approach to Stopping Hypertension) eating plan - Try to get at least 150 minutes of aerobic exercise per week - Aim to go on a brisk walk 30 minutes per day at least 5 days per week. If you're not active, gradually increase how long you walk by 5 minutes each week - limit alcohol: 2 standard drinks per day for men and 1 per day for women - avoid tobacco/nicotine products. Consider smoking cessation if you smoke - weight loss: 7% of current body weight can reduce your blood pressure by 5-10 points - follow-up at least every 6 months for your blood pressure. Follow-up sooner if your BP is not controlled  Food Choices to Lower Your Triglycerides Triglycerides are a type of fat in your blood. High levels of triglycerides can increase the risk of heart disease and stroke. If your triglyceride levels are high, the foods you eat and your eating habits are very important. Choosing the right foods can help lower your triglycerides. What general guidelines do I need to follow?  Lose weight if you are overweight.  Limit or avoid alcohol.  Fill one half of your plate with vegetables and green salads.  Limit fruit to two servings a day. Choose fruit instead of juice.  Make one fourth of your plate whole grains. Look for the word "whole" as the first word in the ingredient list.  Fill one fourth of your plate with lean protein foods.  Enjoy fatty fish (such as salmon, mackerel, sardines, and tuna) three times a week.  Choose healthy fats.  Limit foods high in starch and sugar.  Eat more home-cooked  food and less restaurant, buffet, and fast food.  Limit fried foods.  Cook foods using methods other than frying.  Limit saturated fats.  Check ingredient lists to avoid foods with partially hydrogenated oils (trans fats) in them. What foods can I eat? Grains Whole grains, such as whole wheat or whole grain breads, crackers, cereals, and pasta. Unsweetened oatmeal, bulgur, barley, quinoa, or brown rice. Corn or whole wheat flour tortillas. Vegetables Fresh or frozen vegetables (raw, steamed, roasted, or grilled). Green salads. Fruits All fresh, canned (in natural juice), or frozen fruits. Meat and Other Protein Products Ground beef (85% or leaner), grass-fed beef, or beef trimmed of fat. Skinless chicken or [place]. Ground chicken or [place]. Pork trimmed of fat. All fish and seafood. Eggs. Dried beans, peas, or lentils. Unsalted nuts or seeds. Unsalted canned or dry beans. Dairy Low-fat dairy products, such as skim or 1% milk, 2% or reduced-fat cheeses, low-fat ricotta or cottage cheese, or plain low-fat yogurt. Fats and Oils Tub margarines without trans fats. Light or reduced-fat mayonnaise and salad dressings. Avocado. Safflower, olive, or canola oils. Natural peanut or almond butter. The items listed above may not be a complete list of recommended foods or beverages. Contact your dietitian for more options. What foods are not recommended? Grains White bread. White pasta. White rice. Cornbread. Bagels, pastries, and croissants. Crackers that contain trans fat. Vegetables White potatoes. Corn. Creamed or fried vegetables. Vegetables in a cheese sauce. Fruits Dried fruits. Canned  fruit in light or heavy syrup. Fruit juice. Meat and Other Protein Products Fatty cuts of meat. Ribs, chicken wings, bacon, sausage, bologna, salami, chitterlings, fatback, hot dogs, bratwurst, and packaged luncheon meats. Dairy Whole or 2% milk, cream, half-and-half, and cream cheese. Whole-fat or sweetened  yogurt. Full-fat cheeses. Nondairy creamers and whipped toppings. Processed cheese, cheese spreads, or cheese curds. Sweets and Desserts Corn syrup, sugars, honey, and molasses. Candy. Jam and jelly. Syrup. Sweetened cereals. Cookies, pies, cakes, donuts, muffins, and ice cream. Fats and Oils Butter, stick margarine, lard, shortening, ghee, or bacon fat. Coconut, palm kernel, or palm oils. Beverages Alcohol. Sweetened drinks (such as sodas, lemonade, and fruit drinks or punches). The items listed above may not be a complete list of foods and beverages to avoid. Contact your dietitian for more information. This information is not intended to replace advice given to you by your health care provider. Make sure you discuss any questions you have with your health care provider. Document Released: 01/15/2004 Document Revised: 09/04/2015 Document Reviewed: 01/31/2013 Elsevier Interactive Patient Education  2017 [employer]

## 2018-03-04 LAB — CBC
HEMATOCRIT: 46.2 % (ref 38.5–50.0)
Hemoglobin: 16 g/dL (ref 13.2–17.1)
MCH: 31.4 pg (ref 27.0–33.0)
MCHC: 34.6 g/dL (ref 32.0–36.0)
MCV: 90.6 fL (ref 80.0–100.0)
MPV: 10.6 fL (ref 7.5–12.5)
PLATELETS: 269 10*3/uL (ref 140–400)
RBC: 5.1 10*6/uL (ref 4.20–5.80)
RDW: 12.6 % (ref 11.0–15.0)
WBC: 4.9 10*3/uL (ref 3.8–10.8)

## 2018-03-04 LAB — THYROID PANEL WITH TSH
FREE THYROXINE INDEX: 1.4 (ref 1.4–3.8)
T3 UPTAKE: 29 % (ref 22–35)
T4 TOTAL: 4.7 ug/dL — AB (ref 4.9–10.5)
TSH: 1.65 mIU/L (ref 0.40–4.50)

## 2018-03-04 LAB — HEMOGLOBIN A1C
HEMOGLOBIN A1C: 5.8 %{Hb} — AB (ref <5.7)
Mean Plasma Glucose: 120 (calc)
eAG (mmol/L): 6.6 (calc)

## 2018-03-04 LAB — TESTOSTERONE: Testosterone: 226 ng/dL — ABNORMAL LOW (ref 250–827)

## 2018-03-07 ENCOUNTER — Encounter: Payer: Self-pay | Admitting: Physician Assistant

## 2018-03-07 ENCOUNTER — Other Ambulatory Visit: Payer: Self-pay | Admitting: Physician Assistant

## 2018-03-07 DIAGNOSIS — R7989 Other specified abnormal findings of blood chemistry: Secondary | ICD-10-CM | POA: Insufficient documentation

## 2018-03-07 DIAGNOSIS — R7303 Prediabetes: Secondary | ICD-10-CM

## 2018-03-07 HISTORY — DX: Prediabetes: R73.03

## 2018-03-07 NOTE — Progress Notes (Signed)
Testosterone level was low.  We need to confirm this with additional testing, which will also help to distinguish whether this is primary or secondary hypogonadism.  I would like him to return for morning fasting 8 AM labs at earliest convenience. A1c is in the prediabetic range.  This does not require medication.  Should be managed with diet and lifestyle changes.  Recommend limiting carbohydrates to 45 g per meal and following a low-fat heart healthy diet such as Mediterranean or D ASH.

## 2018-03-23 ENCOUNTER — Other Ambulatory Visit: Payer: Self-pay | Admitting: Physician Assistant

## 2018-03-23 DIAGNOSIS — R7989 Other specified abnormal findings of blood chemistry: Secondary | ICD-10-CM

## 2018-03-24 ENCOUNTER — Other Ambulatory Visit: Payer: Self-pay | Admitting: Physician Assistant

## 2018-03-24 ENCOUNTER — Telehealth: Payer: Self-pay

## 2018-03-24 ENCOUNTER — Ambulatory Visit (HOSPITAL_BASED_OUTPATIENT_CLINIC_OR_DEPARTMENT_OTHER): Payer: Commercial Managed Care - PPO | Attending: Physician Assistant | Admitting: Internal Medicine

## 2018-03-24 DIAGNOSIS — R0683 Snoring: Secondary | ICD-10-CM

## 2018-03-24 DIAGNOSIS — G4710 Hypersomnia, unspecified: Secondary | ICD-10-CM | POA: Diagnosis present

## 2018-03-24 DIAGNOSIS — R0902 Hypoxemia: Secondary | ICD-10-CM | POA: Diagnosis not present

## 2018-03-24 DIAGNOSIS — R5383 Other fatigue: Secondary | ICD-10-CM | POA: Insufficient documentation

## 2018-03-24 NOTE — Telephone Encounter (Signed)
Pt left vm on 12/6 saying that he got his lab work done.  He said that he seen that his testosterone levels on his mychart and noticed that they were low and was ready to start treatment.  I called him back leaving a vm saying that he did not get the second part of his lab work done and he could get this done at his earliest convenience.  He called back on 12/9 saying that he did get labs done.  I called pt back and was able to speak with him.  I explained to him that he didn't get the second round of labs done and why it was necessary from him to have it done. He demand that Vinetta BergamoCharley go ahead start treatment without the additional labs and that she shouldn't file his insurance for treatment.  I told him that we couldn't just not file his insurance and he said that I was a liar and that that wasn't true. Vinetta BergamoCharley was standing in front of me and mouthed to me that she would be happy to explain more in detail during a office visit. I related the message to him and he yelled that he was reporting me and [facility] refusing to take care of him. -EH/RMA

## 2018-03-27 LAB — FSH/LH
FSH: 2.2 m[IU]/mL (ref 1.6–8.0)
LH: 3 m[IU]/mL (ref 1.5–9.3)

## 2018-03-27 LAB — TESTOSTERONE TOTAL,FREE,BIO, MALES
Albumin: 4.6 g/dL (ref 3.6–5.1)
SEX HORMONE BINDING: 20 nmol/L (ref 10–50)
Testosterone: 236 ng/dL — ABNORMAL LOW (ref 250–827)

## 2018-03-27 LAB — PSA: PSA: 0.4 ng/mL (ref <=4.0)

## 2018-03-28 ENCOUNTER — Encounter: Payer: Self-pay | Admitting: Physician Assistant

## 2018-03-28 DIAGNOSIS — E291 Testicular hypofunction: Secondary | ICD-10-CM | POA: Insufficient documentation

## 2018-03-29 ENCOUNTER — Encounter: Payer: Self-pay | Admitting: Physician Assistant

## 2018-03-31 ENCOUNTER — Encounter: Payer: Self-pay | Admitting: Physician Assistant

## 2018-03-31 ENCOUNTER — Ambulatory Visit: Payer: Commercial Managed Care - PPO | Admitting: Physician Assistant

## 2018-03-31 VITALS — BP 157/79 | HR 90 | Wt 245.0 lb

## 2018-03-31 DIAGNOSIS — Z79890 Hormone replacement therapy (postmenopausal): Secondary | ICD-10-CM | POA: Diagnosis not present

## 2018-03-31 DIAGNOSIS — Z5181 Encounter for therapeutic drug level monitoring: Secondary | ICD-10-CM | POA: Diagnosis not present

## 2018-03-31 DIAGNOSIS — E291 Testicular hypofunction: Secondary | ICD-10-CM

## 2018-03-31 MED ORDER — TESTOSTERONE CYPIONATE 200 MG/ML IM SOLN: 200.0000 mg | Freq: Once | INTRAMUSCULAR | Status: DC

## 2018-03-31 MED ORDER — TESTOSTERONE CYPIONATE 200 MG/ML IM SOLN
150.0000 mg | Freq: Once | INTRAMUSCULAR | Status: AC
  Administered 2018-03-31: 150 mg via INTRAMUSCULAR

## 2018-03-31 MED ORDER — TESTOSTERONE CYPIONATE 200 MG/ML IM SOLN: 150.0000 mg | INTRAMUSCULAR | 0 refills | Status: DC

## 2018-03-31 NOTE — Telephone Encounter (Signed)
Received a fax form [facility] that Testosterone has been approved from 03/31/2018 through 03/31/2019. Pharmacy aware and form sent to scan.

## 2018-03-31 NOTE — Progress Notes (Signed)
HPI:                                                                Leon Henson is a 47 y.o. male who [facility] today for new diagnosis of hypogonadism  New diagnosis of hypogonadism. He is fairly symptomatic C/o feeling tired/sluggish, low motivation, worsening ED symptoms and weight gain. Typically sleeps 7 hours/night, does not feel rested. Denies depressed mood.  Endorses history of snoring, no witnessed apnea, denies PND. Recently completed home sleep study, report is in progress  He is eager to get started on testosterone replacement therapy. Wants to feel better and get back to the gym. Interested in injections. Does not plan on having any children so fertility is not a concern.  [facility] 03/31/2018 02/28/2018 09/30/2017 06/09/2017  Decreased Interest 2 1 0 0  Down, Depressed, Hopeless 0 0 0 0  PHQ - 2 Score 2 1 0 0  Altered sleeping 2 1 1  -  Tired, decreased energy 3 1 1  -  Change in appetite 2 0 0 -  Feeling bad or failure about yourself  1 0 0 -  Trouble concentrating 0 1 0 -  Moving slowly or fidgety/restless 0 0 0 -  Suicidal thoughts 0 0 0 -  PHQ-9 Score 10 4 2  -  Difficult doing work/chores Somewhat difficult Somewhat difficult Not difficult at all -    GAD 7 : Generalized Anxiety Score 03/31/2018 02/28/2018 09/30/2017  Nervous, Anxious, on Edge 1 1 1   Control/stop worrying 0 0 0  Worry too much - different things 0 0 0  Trouble relaxing 1 1 0  Restless 0 0 0  Easily annoyed or irritable 2 0 0  Afraid - awful might happen 0 0 0  Total GAD 7 Score 4 2 1   Anxiety Difficulty Somewhat difficult Somewhat difficult Not difficult at all      Past Medical History:  Diagnosis Date  . Anxiety   . Gout   . Hypertension   . Prediabetes 03/07/2018   Past Surgical History:  Procedure Laterality Date  . KNEE ARTHROCENTESIS Left    Social History   Tobacco Use  . Smoking status: Never  Smoker  . Smokeless tobacco: Never Used  Substance Use Topics  . Alcohol use: Yes    Alcohol/week: 25.0 standard drinks    Types: 25 Cans of beer per week   family history includes Heart attack in his mother; Heart failure in his mother; Hypertension in his father.    ROS: negative except as noted in the HPI  Medications: Current Outpatient Medications  Medication Sig Dispense Refill  . amLODipine-valsartan (EXFORGE) 10-160 MG tablet Take 1 tablet by mouth daily. 90 tablet 1  . atorvastatin (LIPITOR) 20 MG tablet Take 1 tablet (20 mg total) by mouth daily. 90 tablet 3  . citalopram (CELEXA) 10 MG tablet Take 10 mg by mouth daily.    . sildenafil (REVATIO) 20 MG tablet Take 1-5 tablets PO prn 50 tablet 11  . testosterone cypionate (DEPOTESTOSTERONE CYPIONATE) 200 MG/ML injection Inject 0.75 mLs (150 mg total) into the muscle every 14 (fourteen) days. 5 mL 0   No current facility-administered medications for this visit.  Allergies  Allergen Reactions  . Hydrochlorothiazide Other (See Comments)    ED, muscle cramps, gout  . Lisinopril Cough       Objective:  BP (!) 157/79   Pulse 90   Wt 245 lb (111.1 kg)   BMI 33.23 kg/m  Vitals:   03/31/18 1057 03/31/18 1125  BP: (!) 164/86 (!) 157/79  Pulse: 90    Gen:  alert, not ill-appearing, no distress, appropriate for age HEENT: head normocephalic without obvious abnormality, conjunctiva and cornea clear, trachea midline Pulm: Normal work of breathing, normal phonation, clear to auscultation bilaterally  Neuro: alert and oriented x 3, no tremor MSK: extremities atraumatic, normal gait and station Skin: intact, no rashes on exposed skin, no jaundice, no cyanosis Psych: appearance casual, cooperative, good eye contact, euthymic mood, flat affect, speech is articulate, and thought processes clear and goal-directed  Lab Results  Component Value Date   HGB 16.0 03/03/2018   Lab Results  Component Value Date   HCT 46.2  03/03/2018   Lab Results  Component Value Date   TESTOSTERONE 236 (L) 03/24/2018    No results found for this or any previous visit (from the past 72 hour(s)). No results found.    Assessment and Plan: 47 y.o. male with   .Leon Henson was seen today for treatment planning.  Diagnoses and all orders for this visit:  Encounter for monitoring testosterone replacement therapy -     Testosterone -     Hemoglobin and hematocrit, blood  Hypogonadism male -     Testosterone -     Hemoglobin and hematocrit, blood -     testosterone cypionate (DEPOTESTOSTERONE CYPIONATE) 200 MG/ML injection; Inject 0.75 mLs (150 mg total) into the muscle every 14 (fourteen) days. -     Discontinue: testosterone cypionate (DEPOTESTOSTERONE CYPIONATE) injection 200 mg -     testosterone cypionate (DEPOTESTOSTERONE CYPIONATE) injection 150 mg   To confirmed low morning fasting testosterone levels with low normal FSH and LH PSA and hematocrit are within normal limits  Reviewed risks and benefits of testosterone replacement therapy in detail with patient.  Written informed consent obtained.  Patient understands he will need to agreed to regular serum monitoring of his testosterone and hematocrit as well as annual PSA. Starting testosterone cypionate 150 mg every 14 days.  For now he would like to receive his injections here in the office.  He is aware at any time he can receive education on self injection. First dose given in office today.  Return in 2 weeks for nurse visit. Recheck midcycle morning fasting testosterone and hematocrit in 3 months  Awaiting results of home sleep study.  If there is obstructive sleep apnea we will need to closely monitor for worsening apnea.  Blood pressure elevated on 2 in office checks today.  Patient reports he just took his blood pressure medicine within the hour.  Continue current medications and monitor blood pressure at home.  Patient education and anticipatory guidance  given Patient agrees with treatment plan Follow-up in 3 months for hypogonadism as needed if symptoms worsen or fail to improve  Levonne Hubertharley E. Almedia Cordell PA-C

## 2018-03-31 NOTE — Patient Instructions (Signed)
Return in approx 3 months for fasting 8 am mid-cycle testosterone level (should be done 1 week after injection)  Testosterone injection What is this medicine? TESTOSTERONE (tes TOS ter one) is the main male hormone. It supports normal male development such as muscle growth, facial hair, and deep voice. It is used in males to treat low testosterone levels. This medicine may be used for other purposes; ask your health care provider or pharmacist if you have questions. COMMON BRAND NAME(S): Andro-L.A., Aveed, Delatestryl, Depo-Testosterone, Virilon What should I tell my health care provider before I take this medicine? They need to know if you have any of these conditions: -cancer -diabetes -heart disease -kidney disease -liver disease -lung disease -prostate disease -an unusual or allergic reaction to testosterone, other medicines, foods, dyes, or preservatives -pregnant or trying to get pregnant -breast-feeding How should I use this medicine? This medicine is for injection into a muscle. It is usually given by a health care professional in a hospital or clinic setting. Contact your pediatrician regarding the use of this medicine in children. While this medicine may be prescribed for children as young as 48 years of age for selected conditions, precautions do apply. Overdosage: If you think you have taken too much of this medicine contact a poison control center or emergency room at once. NOTE: This medicine is only for you. Do not share this medicine with others. What if I miss a dose? Try not to miss a dose. Your doctor or health care professional will tell you when your next injection is due. Notify the office if you are unable to keep an appointment. What may interact with this medicine? -medicines for diabetes -medicines that treat or prevent blood clots like warfarin -oxyphenbutazone -propranolol -steroid medicines like prednisone or cortisone This list may not describe all  possible interactions. Give your health care provider a list of all the medicines, herbs, non-prescription drugs, or dietary supplements you use. Also tell them if you smoke, drink alcohol, or use illegal drugs. Some items may interact with your medicine. What should I watch for while using this medicine? Visit your doctor or health care professional for regular checks on your progress. They will need to check the level of testosterone in your blood. This medicine is only approved for use in men who have low levels of testosterone related to certain medical conditions. Heart attacks and strokes have been reported with the use of this medicine. Notify your doctor or health care professional and seek emergency treatment if you develop breathing problems; changes in vision; confusion; chest pain or chest tightness; sudden arm pain; severe, sudden headache; trouble speaking or understanding; sudden numbness or weakness of the face, arm or leg; loss of balance or coordination. Talk to your doctor about the risks and benefits of this medicine. This medicine may affect blood sugar levels. If you have diabetes, check with your doctor or health care professional before you change your diet or the dose of your diabetic medicine. Testosterone injections are not commonly used in women. Women should inform their doctor if they wish to become pregnant or think they might be pregnant. There is a potential for serious side effects to an unborn child. Talk to your health care professional or pharmacist for more information. Talk with your doctor or health care professional about your birth control options while taking this medicine. This drug is banned from use in athletes by most athletic organizations. What side effects may I notice from receiving this medicine? Side effects  that you should report to your doctor or health care professional as soon as possible: -allergic reactions like skin rash, itching or hives, swelling  of the face, lips, or tongue -breast enlargement -breathing problems -changes in emotions or moods -deep or hoarse voice -irregular menstrual periods -signs and symptoms of liver injury like dark yellow or brown urine; general ill feeling or flu-like symptoms; light-colored stools; loss of appetite; nausea; right upper belly pain; unusually weak or tired; yellowing of the eyes or skin -stomach pain -swelling of the ankles, feet, hands -too frequent or persistent erections -trouble passing urine or change in the amount of urine Side effects that usually do not require medical attention (report to your doctor or health care professional if they continue or are bothersome): -acne -change in sex drive or performance -facial hair growth -hair loss -headache This list may not describe all possible side effects. Call your doctor for medical advice about side effects. You may report side effects to FDA at 1-800-FDA-1088. Where should I keep my medicine? Keep out of the reach of children. This medicine can be abused. Keep your medicine in a safe place to protect it from theft. Do not share this medicine with anyone. Selling or giving away this medicine is dangerous and against the law. Store at room temperature between 20 and 25 degrees C (68 and 77 degrees F). Do not freeze. Protect from light. Follow the directions for the product you are prescribed. Throw away any unused medicine after the expiration date. NOTE: This sheet is a summary. It may not cover all possible information. If you have questions about this medicine, talk to your doctor, pharmacist, or health care provider.  2019 Elsevier/Gold Standard (2015-05-03 07:33:55)

## 2018-04-01 DIAGNOSIS — G4710 Hypersomnia, unspecified: Secondary | ICD-10-CM | POA: Diagnosis not present

## 2018-04-01 DIAGNOSIS — R0683 Snoring: Secondary | ICD-10-CM

## 2018-04-01 NOTE — Procedures (Signed)
    Patient Name: Leon Henson, Leon Henson Date: 03/25/2018 Gender: Male D.O.B: Apr 10, 1971 Age (years): 47 Referring Provider: Carlis Stableharley Elizabeth Cummings PA-C Height (inches): 72 Interpreting Physician: Jetty Duhamellinton Sorcha Rotunno MD, ABSM Weight (lbs): 240 RPSGT: [place], [place] BMI: 33 MRN: [id number] Neck Size: 18.00  CLINICAL INFORMATION Sleep Henson Type: HST Indication for sleep Henson: Daytime Fatigue, Snoring Epworth Sleepiness Score: 2  SLEEP Henson TECHNIQUE A multi-channel overnight portable sleep Henson was performed. The channels recorded were: nasal airflow, thoracic respiratory movement, and oxygen saturation with a pulse oximetry. Snoring was also monitored.  MEDICATIONS Patient self administered medications include: none reported.  SLEEP ARCHITECTURE Patient was studied for 371.2 minutes. The sleep efficiency was 99.6 % and the patient was supine for 0.2%. The arousal index was 0.0 per hour.  RESPIRATORY PARAMETERS The overall AHI was 3.7 per hour, with a central apnea index of 0.0 per hour. The oxygen nadir was 82% during sleep.  CARDIAC DATA Mean heart rate during sleep was 68.4 bpm.  IMPRESSIONS - No significant obstructive sleep apnea occurred during this Henson (AHI = 3.7/h). - No significant central sleep apnea occurred during this Henson (CAI = 0.0/h). - Moderate oxygen desaturation was noted during this Henson (Min O2 = 82%). Mean sat 91%. Time < 89% was 9.4 minutes. - Patient snored.  DIAGNOSIS - Nocturnal Hypoxemia (327.26 [G47.36 ICD-10]) - Primary snoring  RECOMMENDATIONS - Consider if nocturnal hyopoxemia needs to be assessed.  - Be careful with alcohol, sedatives and other CNS depressants that may worsen sleep apnea and disrupt normal sleep architecture. - Sleep hygiene should be reviewed to assess factors that may improve sleep quality. - Weight management and regular exercise should be initiated or continued.  [Electronically signed] 04/01/2018 12:24  PM  Jetty Duhamellinton Prerna Harold MD, ABSM Diplomate, American Board of Sleep Medicine   NPI: [id number]                         Jetty Duhamellinton Haakon Titsworth Diplomate, American Board of Sleep Medicine  ELECTRONICALLY SIGNED ON:  04/01/2018, 12:21 PM [facility] PH: [phone]   FX: [phone] ACCREDITED BY THE AMERICAN ACADEMY OF SLEEP MEDICINE

## 2018-04-06 MED ORDER — NONFORMULARY OR COMPOUNDED ITEM: 0 refills | Status: DC

## 2018-04-06 NOTE — Addendum Note (Signed)
Addended by: Gena FrayUMMINGS, CHARLEY E on: 04/06/2018 05:05 PM   Modules accepted: Orders

## 2018-04-14 ENCOUNTER — Ambulatory Visit (INDEPENDENT_AMBULATORY_CARE_PROVIDER_SITE_OTHER): Payer: Commercial Managed Care - PPO | Admitting: Physician Assistant

## 2018-04-14 ENCOUNTER — Encounter: Payer: Self-pay | Admitting: Physician Assistant

## 2018-04-14 VITALS — BP 146/87 | HR 109 | Temp 98.5°F | Wt 243.0 lb

## 2018-04-14 DIAGNOSIS — E291 Testicular hypofunction: Secondary | ICD-10-CM | POA: Diagnosis not present

## 2018-04-14 MED ORDER — TESTOSTERONE CYPIONATE 200 MG/ML IM SOLN
150.0000 mg | Freq: Once | INTRAMUSCULAR | Status: AC
  Administered 2018-04-14: 150 mg via INTRAMUSCULAR

## 2018-04-14 NOTE — Progress Notes (Signed)
Pt in today for testosterone injection. Medications and allergies reviewed. Pt denies chest pain, shortness of breath, headaches and denies problems with medication or mood changes.   There were no vitals filed for this visit.   Pt received testosterone in LUOQ . Patient tolerated injection well without complications. Pt advised to schedule next injection in 14 days. Pt supplied his own medication.

## 2018-04-17 ENCOUNTER — Encounter: Payer: Self-pay | Admitting: Physician Assistant

## 2018-04-28 ENCOUNTER — Ambulatory Visit (INDEPENDENT_AMBULATORY_CARE_PROVIDER_SITE_OTHER): Payer: Commercial Managed Care - PPO | Admitting: Physician Assistant

## 2018-04-28 VITALS — BP 137/73 | HR 75

## 2018-04-28 DIAGNOSIS — E291 Testicular hypofunction: Secondary | ICD-10-CM

## 2018-04-28 MED ORDER — TESTOSTERONE CYPIONATE 200 MG/ML IM SOLN
150.0000 mg | Freq: Once | INTRAMUSCULAR | Status: AC
  Administered 2018-04-28: 150 mg via INTRAMUSCULAR

## 2018-04-28 NOTE — Progress Notes (Signed)
Patient came into office today for testosterone injection. Denies chest pain, shortness of breath, headaches and problems associated with taking this medication. Patient states he has had no abnornal mood swings.   Vitals:   04/28/18 1537  BP: 137/73  Pulse: 75     Patient tolerated injection in RUOQ well without complications. Patient advised to schedule his next injection for 2 weeks from today.  Patient has his own Testosterone, he supplies at appointments.

## 2018-04-30 ENCOUNTER — Other Ambulatory Visit: Payer: Self-pay | Admitting: Physician Assistant

## 2018-04-30 DIAGNOSIS — E291 Testicular hypofunction: Secondary | ICD-10-CM

## 2018-05-03 ENCOUNTER — Telehealth: Payer: Self-pay | Admitting: Physician Assistant

## 2018-05-03 NOTE — Telephone Encounter (Signed)
Per Standley Dakins at [facility], patient did not complete his repeat overnight pulse oximetry study. The initial study did not have analyzable data.

## 2018-05-04 NOTE — Telephone Encounter (Signed)
Can you contact patient and tell him that we will use the testosterone we have here in the office for his future injections?

## 2018-05-05 NOTE — Telephone Encounter (Signed)
Left VM with status update.  

## 2018-05-12 ENCOUNTER — Ambulatory Visit (INDEPENDENT_AMBULATORY_CARE_PROVIDER_SITE_OTHER): Payer: Commercial Managed Care - PPO | Admitting: Physician Assistant

## 2018-05-12 VITALS — BP 133/75 | HR 81

## 2018-05-12 DIAGNOSIS — E291 Testicular hypofunction: Secondary | ICD-10-CM

## 2018-05-12 MED ORDER — TESTOSTERONE CYPIONATE 200 MG/ML IM SOLN
150.0000 mg | Freq: Once | INTRAMUSCULAR | Status: AC
  Administered 2018-05-12: 150 mg via INTRAMUSCULAR

## 2018-05-12 NOTE — Progress Notes (Signed)
Patient came into office today for testosterone injection. Denies chest pain, shortness of breath, headaches and problems associated with taking this medication. Patient states he has had no abnornal mood swings.   Vitals:   05/12/18 1430 05/12/18 1438  BP: (!) 147/71 133/75  Pulse: 85 81     Patient tolerated injection in LUOQ well without complications. Patient advised to schedule his next injection for 2 weeks from today.  Patient has his own Testosterone which was supplied for today's injection. This is his last vial, and from next visit on he will be using our supply rather than picking it up from the pharmacy.

## 2018-05-25 ENCOUNTER — Ambulatory Visit (INDEPENDENT_AMBULATORY_CARE_PROVIDER_SITE_OTHER): Payer: Commercial Managed Care - PPO | Admitting: Physician Assistant

## 2018-05-25 VITALS — BP 140/66 | HR 83 | Wt 247.0 lb

## 2018-05-25 DIAGNOSIS — E291 Testicular hypofunction: Secondary | ICD-10-CM | POA: Diagnosis not present

## 2018-05-25 MED ORDER — TESTOSTERONE CYPIONATE 200 MG/ML IM SOLN
150.0000 mg | Freq: Once | INTRAMUSCULAR | Status: AC
  Administered 2018-05-25: 150 mg via INTRAMUSCULAR

## 2018-05-25 NOTE — Progress Notes (Signed)
Acute Office Visit  Subjective:    Patient ID: Leon Henson, male    DOB: 03/20/1971, 48 y.o.   MRN: [id number]  Chief Complaint  Patient presents with  . Hypogonadism    HPI Leon Henson is here for a testosterone injection. Denies chest pain, shortness of breath, headaches or mood changes.   Past Medical History:  Diagnosis Date  . Anxiety   . Gout   . Hypertension   . Prediabetes 03/07/2018    Past Surgical History:  Procedure Laterality Date  . KNEE ARTHROCENTESIS Left     Family History  Problem Relation Age of Onset  . Heart failure Mother   . Heart attack Mother   . Hypertension Father     Social History   Socioeconomic History  . Marital status: Single    Spouse name: Not on file  . Number of children: Not on file  . Years of education: Not on file  . Highest education level: Not on file  Occupational History  . Not on file  Social Needs  . Financial resource strain: Not on file  . Food insecurity:    Worry: Not on file    Inability: Not on file  . Transportation needs:    Medical: Not on file    Non-medical: Not on file  Tobacco Use  . Smoking status: Never Smoker  . Smokeless tobacco: Never Used  Substance and Sexual Activity  . Alcohol use: Yes    Alcohol/week: 25.0 standard drinks    Types: 25 Cans of beer per week  . Drug use: No  . Sexual activity: Yes  Lifestyle  . Physical activity:    Days per week: Not on file    Minutes per session: Not on file  . Stress: Not on file  Relationships  . Social connections:    Talks on phone: Not on file    Gets together: Not on file    Attends religious service: Not on file    Active member of club or organization: Not on file    Attends meetings of clubs or organizations: Not on file    Relationship status: Not on file  . Intimate partner violence:    Fear of current or ex partner: Not on file    Emotionally abused: Not on file    Physically abused: Not on file    Forced sexual activity:  Not on file  Other Topics Concern  . Not on file  Social History Narrative  . Not on file    Outpatient Medications Prior to Visit  Medication Sig Dispense Refill  . amLODipine-valsartan (EXFORGE) 10-160 MG tablet Take 1 tablet by mouth daily. 90 tablet 1  . atorvastatin (LIPITOR) 20 MG tablet Take 1 tablet (20 mg total) by mouth daily. 90 tablet 3  . citalopram (CELEXA) 10 MG tablet Take 10 mg by mouth daily.    . NONFORMULARY OR COMPOUNDED ITEM Overnight pulse oximetry 1 each 0  . sildenafil (REVATIO) 20 MG tablet Take 1-5 tablets PO prn 50 tablet 11  . testosterone cypionate (DEPOTESTOSTERONE CYPIONATE) 200 MG/ML injection Inject 0.75 mLs (150 mg total) into the muscle every 14 (fourteen) days. 5 mL 0   No facility-administered medications prior to visit.     Allergies  Allergen Reactions  . Hydrochlorothiazide Other (See Comments)    ED, muscle cramps, gout  . Lisinopril Cough    ROS     Objective:    Physical Exam  BP 140/66  Pulse 83   Wt 247 lb (112 kg)   SpO2 98%   BMI 33.50 kg/m  Wt Readings from Last 3 Encounters:  05/25/18 247 lb (112 kg)  04/17/18 243 lb (110.2 kg)  03/31/18 245 lb (111.1 kg)    Health Maintenance Due  Topic Date Due  . HIV Screening  05/04/1984    There are no preventive care reminders to display for this patient.   Lab Results  Component Value Date   TSH 1.65 03/03/2018   Lab Results  Component Value Date   WBC 4.9 03/03/2018   HGB 16.0 03/03/2018   HCT 46.2 03/03/2018   MCV 90.6 03/03/2018   PLT 269 03/03/2018   Lab Results  Component Value Date   NA 141 09/30/2017   K 4.3 09/30/2017   CO2 30 09/30/2017   GLUCOSE 112 (H) 09/30/2017   BUN 15 09/30/2017   CREATININE 1.06 09/30/2017   BILITOT 0.8 09/30/2017   AST 43 (H) 09/30/2017   ALT 44 09/30/2017   PROT 7.5 09/30/2017   CALCIUM 10.1 09/30/2017   Lab Results  Component Value Date   CHOL 266 (H) 09/30/2017   Lab Results  Component Value Date   HDL 41  09/30/2017   Lab Results  Component Value Date   LDLCALC  09/30/2017     Comment:     . LDL cholesterol not calculated. Triglyceride levels greater than 400 mg/dL invalidate calculated LDL results. . Reference range: <100 . Desirable range <100 mg/dL for primary prevention;   <70 mg/dL for patients with CHD or diabetic patients  with > or = 2 CHD risk factors. Marland Kitchen LDL-C is now calculated using the Martin-Hopkins  calculation, which is a validated novel method providing  better accuracy than the Friedewald equation in the  estimation of LDL-C.  Horald Pollen et al. Lenox Ahr. 5208;022(33): 2061-2068  (http://education.[facility].com/faq/FAQ164)    Lab Results  Component Value Date   TRIG 431 (H) 09/30/2017   Lab Results  Component Value Date   CHOLHDL 6.5 (H) 09/30/2017   Lab Results  Component Value Date   HGBA1C 5.8 (H) 03/03/2018       Assessment & Plan:  Low testosterone - Patient tolerated injection well without complications. Patient advised to schedule next injection 14 days from today.   Problem List Items Addressed This Visit    None    Visit Diagnoses    Hypogonadism male    -  Primary   Relevant Medications   testosterone cypionate (DEPOTESTOSTERONE CYPIONATE) injection 150 mg (Completed) (Start on 05/25/2018  4:00 PM)       Meds ordered this encounter  Medications  . testosterone cypionate (DEPOTESTOSTERONE CYPIONATE) injection 150 mg     Esmond Harps, CMA

## 2018-05-26 ENCOUNTER — Ambulatory Visit: Payer: Commercial Managed Care - PPO

## 2018-06-09 ENCOUNTER — Ambulatory Visit (INDEPENDENT_AMBULATORY_CARE_PROVIDER_SITE_OTHER): Payer: Commercial Managed Care - PPO | Admitting: Physician Assistant

## 2018-06-09 VITALS — BP 138/88 | HR 81 | Wt 248.0 lb

## 2018-06-09 DIAGNOSIS — E291 Testicular hypofunction: Secondary | ICD-10-CM

## 2018-06-09 MED ORDER — TESTOSTERONE CYPIONATE 200 MG/ML IM SOLN
150.0000 mg | Freq: Once | INTRAMUSCULAR | Status: AC
  Administered 2018-06-09: 150 mg via INTRAMUSCULAR

## 2018-06-09 NOTE — Progress Notes (Signed)
Established Patient Office Visit  Subjective:  Patient ID: Leon Henson, male    DOB: 06/13/1970  Age: 48 y.o. MRN: [id number]  CC:  Chief Complaint  Patient presents with  . Injections    testosterone    HPI Leon Henson presents for testosterone injection. Given in LUOQ without complications. Patient had no complaints of SOB, headache, mood changes, palpitations.Leon Henson  Past Medical History:  Diagnosis Date  . Anxiety   . Gout   . Hypertension   . Prediabetes 03/07/2018    Past Surgical History:  Procedure Laterality Date  . KNEE ARTHROCENTESIS Left     Family History  Problem Relation Age of Onset  . Heart failure Mother   . Heart attack Mother   . Hypertension Father     Social History   Socioeconomic History  . Marital status: Single    Spouse name: Not on file  . Number of children: Not on file  . Years of education: Not on file  . Highest education level: Not on file  Occupational History  . Not on file  Social Needs  . Financial resource strain: Not on file  . Food insecurity:    Worry: Not on file    Inability: Not on file  . Transportation needs:    Medical: Not on file    Non-medical: Not on file  Tobacco Use  . Smoking status: Never Smoker  . Smokeless tobacco: Never Used  Substance and Sexual Activity  . Alcohol use: Yes    Alcohol/week: 25.0 standard drinks    Types: 25 Cans of beer per week  . Drug use: No  . Sexual activity: Yes  Lifestyle  . Physical activity:    Days per week: Not on file    Minutes per session: Not on file  . Stress: Not on file  Relationships  . Social connections:    Talks on phone: Not on file    Gets together: Not on file    Attends religious service: Not on file    Active member of club or organization: Not on file    Attends meetings of clubs or organizations: Not on file    Relationship status: Not on file  . Intimate partner violence:    Fear of current or ex partner: Not on file    Emotionally  abused: Not on file    Physically abused: Not on file    Forced sexual activity: Not on file  Other Topics Concern  . Not on file  Social History Narrative  . Not on file    Outpatient Medications Prior to Visit  Medication Sig Dispense Refill  . amLODipine-valsartan (EXFORGE) 10-160 MG tablet Take 1 tablet by mouth daily. 90 tablet 1  . atorvastatin (LIPITOR) 20 MG tablet Take 1 tablet (20 mg total) by mouth daily. 90 tablet 3  . citalopram (CELEXA) 10 MG tablet Take 10 mg by mouth daily.    . NONFORMULARY OR COMPOUNDED ITEM Overnight pulse oximetry 1 each 0  . sildenafil (REVATIO) 20 MG tablet Take 1-5 tablets PO prn 50 tablet 11  . testosterone cypionate (DEPOTESTOSTERONE CYPIONATE) 200 MG/ML injection Inject 0.75 mLs (150 mg total) into the muscle every 14 (fourteen) days. 5 mL 0   No facility-administered medications prior to visit.     Allergies  Allergen Reactions  . Hydrochlorothiazide Other (See Comments)    ED, muscle cramps, gout  . Lisinopril Cough    ROS Review of Systems    Objective:  Physical Exam  BP 138/88 (BP Location: Left Arm, Patient Position: Sitting, Cuff Size: Large)   Pulse 81   Wt 248 lb (112.5 Leon)   SpO2 99%   BMI 33.63 Leon/m  Wt Readings from Last 3 Encounters:  06/09/18 248 lb (112.5 Leon)  05/25/18 247 lb (112 Leon)  04/17/18 243 lb (110.2 Leon)     Health Maintenance Due  Topic Date Due  . HIV Screening  05/04/1984    There are no preventive care reminders to display for this patient.  Lab Results  Component Value Date   TSH 1.65 03/03/2018   Lab Results  Component Value Date   WBC 4.9 03/03/2018   HGB 16.0 03/03/2018   HCT 46.2 03/03/2018   MCV 90.6 03/03/2018   PLT 269 03/03/2018   Lab Results  Component Value Date   NA 141 09/30/2017   K 4.3 09/30/2017   CO2 30 09/30/2017   GLUCOSE 112 (H) 09/30/2017   BUN 15 09/30/2017   CREATININE 1.06 09/30/2017   BILITOT 0.8 09/30/2017   AST 43 (H) 09/30/2017   ALT 44  09/30/2017   PROT 7.5 09/30/2017   CALCIUM 10.1 09/30/2017   Lab Results  Component Value Date   CHOL 266 (H) 09/30/2017   Lab Results  Component Value Date   HDL 41 09/30/2017   Lab Results  Component Value Date   LDLCALC  09/30/2017     Comment:     . LDL cholesterol not calculated. Triglyceride levels greater than 400 mg/dL invalidate calculated LDL results. . Reference range: <100 . Desirable range <100 mg/dL for primary prevention;   <70 mg/dL for patients with CHD or diabetic patients  with > or = 2 CHD risk factors. Marland Kitchen LDL-C is now calculated using the Martin-Hopkins  calculation, which is a validated novel method providing  better accuracy than the Friedewald equation in the  estimation of LDL-C.  Leon Henson et al. Lenox Ahr. 3734;287(68): 2061-2068  (http://education.[facility].com/faq/FAQ164)    Lab Results  Component Value Date   TRIG 431 (H) 09/30/2017   Lab Results  Component Value Date   CHOLHDL 6.5 (H) 09/30/2017   Lab Results  Component Value Date   HGBA1C 5.8 (H) 03/03/2018      Assessment & Plan:   Problem List Items Addressed This Visit    None      No orders of the defined types were placed in this encounter.   Follow-up: No follow-ups on file.    Leon Sloop, Henson

## 2018-06-12 ENCOUNTER — Encounter: Payer: Self-pay | Admitting: Physician Assistant

## 2018-06-12 NOTE — Progress Notes (Signed)
Please instruct patient to have labs drawn at 8 am 1 week after his next injection Orders for Hgb/Hct and testosterone placed

## 2018-06-23 ENCOUNTER — Other Ambulatory Visit: Payer: Self-pay

## 2018-06-23 ENCOUNTER — Ambulatory Visit (INDEPENDENT_AMBULATORY_CARE_PROVIDER_SITE_OTHER): Payer: Commercial Managed Care - PPO | Admitting: Physician Assistant

## 2018-06-23 VITALS — BP 151/73 | HR 93 | Wt 245.0 lb

## 2018-06-23 DIAGNOSIS — I10 Essential (primary) hypertension: Secondary | ICD-10-CM

## 2018-06-23 DIAGNOSIS — E291 Testicular hypofunction: Secondary | ICD-10-CM

## 2018-06-23 MED ORDER — TESTOSTERONE CYPIONATE 200 MG/ML IM SOLN
150.0000 mg | Freq: Once | INTRAMUSCULAR | Status: AC
  Administered 2018-06-23: 150 mg via INTRAMUSCULAR

## 2018-06-23 NOTE — Progress Notes (Signed)
Established Patient Office Visit  Subjective:  Patient ID: Leon Henson, male    DOB: 1971-04-03  Age: 49 y.o. MRN: [id number]  CC:  Chief Complaint  Patient presents with  . Hypogonadism    HPI   Leon Henson is here for a testosterone injection. Denies chest pain, shortness of breath, headaches or mood changes.   Past Medical History:  Diagnosis Date  . Anxiety   . Gout   . Hypertension   . Prediabetes 03/07/2018    Past Surgical History:  Procedure Laterality Date  . KNEE ARTHROCENTESIS Left     Family History  Problem Relation Age of Onset  . Heart failure Mother   . Heart attack Mother   . Hypertension Father     Social History   Socioeconomic History  . Marital status: Single    Spouse name: Not on file  . Number of children: Not on file  . Years of education: Not on file  . Highest education level: Not on file  Occupational History  . Not on file  Social Needs  . Financial resource strain: Not on file  . Food insecurity:    Worry: Not on file    Inability: Not on file  . Transportation needs:    Medical: Not on file    Non-medical: Not on file  Tobacco Use  . Smoking status: Never Smoker  . Smokeless tobacco: Never Used  Substance and Sexual Activity  . Alcohol use: Yes    Alcohol/week: 25.0 standard drinks    Types: 25 Cans of beer per week  . Drug use: No  . Sexual activity: Yes  Lifestyle  . Physical activity:    Days per week: Not on file    Minutes per session: Not on file  . Stress: Not on file  Relationships  . Social connections:    Talks on phone: Not on file    Gets together: Not on file    Attends religious service: Not on file    Active member of club or organization: Not on file    Attends meetings of clubs or organizations: Not on file    Relationship status: Not on file  . Intimate partner violence:    Fear of current or ex partner: Not on file    Emotionally abused: Not on file    Physically abused: Not on file   Forced sexual activity: Not on file  Other Topics Concern  . Not on file  Social History Narrative  . Not on file    Outpatient Medications Prior to Visit  Medication Sig Dispense Refill  . amLODipine-valsartan (EXFORGE) 10-160 MG tablet Take 1 tablet by mouth daily. 90 tablet 1  . atorvastatin (LIPITOR) 20 MG tablet Take 1 tablet (20 mg total) by mouth daily. 90 tablet 3  . citalopram (CELEXA) 10 MG tablet Take 10 mg by mouth daily.    . NONFORMULARY OR COMPOUNDED ITEM Overnight pulse oximetry 1 each 0  . sildenafil (REVATIO) 20 MG tablet Take 1-5 tablets PO prn 50 tablet 11  . testosterone cypionate (DEPOTESTOSTERONE CYPIONATE) 200 MG/ML injection Inject 0.75 mLs (150 mg total) into the muscle every 14 (fourteen) days. 5 mL 0   No facility-administered medications prior to visit.     Allergies  Allergen Reactions  . Hydrochlorothiazide Other (See Comments)    ED, muscle cramps, gout  . Lisinopril Cough    ROS Review of Systems    Objective:    Physical Exam  BP Marland Kitchen)  151/79   Pulse 93   Wt 245 lb (111.1 kg)   SpO2 99%   BMI 33.23 kg/m  Wt Readings from Last 3 Encounters:  06/23/18 245 lb (111.1 kg)  06/09/18 248 lb (112.5 kg)  05/25/18 247 lb (112 kg)     Health Maintenance Due  Topic Date Due  . HIV Screening  05/04/1984  . TETANUS/TDAP  05/04/1988    There are no preventive care reminders to display for this patient.  Lab Results  Component Value Date   TSH 1.65 03/03/2018   Lab Results  Component Value Date   WBC 4.9 03/03/2018   HGB 16.0 03/03/2018   HCT 46.2 03/03/2018   MCV 90.6 03/03/2018   PLT 269 03/03/2018   Lab Results  Component Value Date   NA 141 09/30/2017   K 4.3 09/30/2017   CO2 30 09/30/2017   GLUCOSE 112 (H) 09/30/2017   BUN 15 09/30/2017   CREATININE 1.06 09/30/2017   BILITOT 0.8 09/30/2017   AST 43 (H) 09/30/2017   ALT 44 09/30/2017   PROT 7.5 09/30/2017   CALCIUM 10.1 09/30/2017   Lab Results  Component Value Date    CHOL 266 (H) 09/30/2017   Lab Results  Component Value Date   HDL 41 09/30/2017   Lab Results  Component Value Date   LDLCALC  09/30/2017     Comment:     . LDL cholesterol not calculated. Triglyceride levels greater than 400 mg/dL invalidate calculated LDL results. . Reference range: <100 . Desirable range <100 mg/dL for primary prevention;   <70 mg/dL for patients with CHD or diabetic patients  with > or = 2 CHD risk factors. Marland Kitchen LDL-C is now calculated using the Martin-Hopkins  calculation, which is a validated novel method providing  better accuracy than the Friedewald equation in the  estimation of LDL-C.  Horald Pollen et al. Lenox Ahr. 7616;073(71): 2061-2068  (http://education.[facility].com/faq/FAQ164)    Lab Results  Component Value Date   TRIG 431 (H) 09/30/2017   Lab Results  Component Value Date   CHOLHDL 6.5 (H) 09/30/2017   Lab Results  Component Value Date   HGBA1C 5.8 (H) 03/03/2018      Assessment & Plan:  Low testosterone - Patient tolerated injection well without complications. Patient advised to schedule next injection 14 days from today.    Problem List Items Addressed This Visit    None    Visit Diagnoses    Hypogonadism male    -  Primary   Relevant Medications   testosterone cypionate (DEPOTESTOSTERONE CYPIONATE) injection 150 mg (Completed)      Meds ordered this encounter  Medications  . testosterone cypionate (DEPOTESTOSTERONE CYPIONATE) injection 150 mg    Follow-up: Return in about 2 weeks (around 07/07/2018) for testosterone injection. Earna Coder, Janalyn Harder, CMA

## 2018-06-30 ENCOUNTER — Encounter: Payer: Self-pay | Admitting: Physician Assistant

## 2018-06-30 NOTE — Progress Notes (Signed)
BP elevated in office today Patient has documented white coat syndrome with HTN Continue current medications and cont to monitor BP at home Repeat fasting mid-cycle testosterone, Hgb/Hct in 4 months  Levonne Hubert PA-C

## 2018-07-07 ENCOUNTER — Other Ambulatory Visit: Payer: Self-pay

## 2018-07-07 ENCOUNTER — Ambulatory Visit (INDEPENDENT_AMBULATORY_CARE_PROVIDER_SITE_OTHER): Payer: Commercial Managed Care - PPO | Admitting: Physician Assistant

## 2018-07-07 VITALS — BP 140/86 | HR 99 | Wt 247.0 lb

## 2018-07-07 DIAGNOSIS — E291 Testicular hypofunction: Secondary | ICD-10-CM | POA: Diagnosis not present

## 2018-07-07 MED ORDER — TESTOSTERONE CYPIONATE 200 MG/ML IM SOLN
150.0000 mg | Freq: Once | INTRAMUSCULAR | Status: AC
  Administered 2018-07-07: 150 mg via INTRAMUSCULAR

## 2018-07-07 NOTE — Progress Notes (Signed)
   Subjective:    Patient ID: Leon Henson, male    DOB: Jan 10, 1971, 48 y.o.   MRN: [id number]  HPI Patient is here for a testosterone injection. Denies chest pain, shortness of breath, headaches, problems with mood change, or medication problem   Review of Systems     Objective:   Physical Exam Vitals:   07/07/18 1624 07/07/18 1627  BP: (!) 151/95 140/86  Pulse: (!) 107 99  SpO2: 100%           Assessment & Plan:  Patient tolerated injection in ROUQ well without complications. Patient advised to schedule his next injection for 2 weeks from today.  Blood pressure was elevated during initial check at 151/95 but after 5 minutes of rest BP cam down to 140/86  **Administered today in clinic, pt declined home injection education**

## 2018-07-09 ENCOUNTER — Encounter: Payer: Self-pay | Admitting: Physician Assistant

## 2018-07-14 ENCOUNTER — Ambulatory Visit: Payer: Commercial Managed Care - PPO | Admitting: Physician Assistant

## 2018-07-20 ENCOUNTER — Ambulatory Visit (INDEPENDENT_AMBULATORY_CARE_PROVIDER_SITE_OTHER): Payer: Commercial Managed Care - PPO | Admitting: Sports Medicine

## 2018-07-20 VITALS — BP 167/93 | HR 97

## 2018-07-20 DIAGNOSIS — I10 Essential (primary) hypertension: Secondary | ICD-10-CM

## 2018-07-20 DIAGNOSIS — E291 Testicular hypofunction: Secondary | ICD-10-CM | POA: Diagnosis not present

## 2018-07-20 MED ORDER — AMLODIPINE BESYLATE-VALSARTAN 10-320 MG PO TABS: 1.0000 | ORAL_TABLET | Freq: Every day | ORAL | 1 refills | Status: DC

## 2018-07-20 MED ORDER — TESTOSTERONE CYPIONATE 200 MG/ML IM SOLN
150.0000 mg | Freq: Once | INTRAMUSCULAR | Status: AC
  Administered 2018-07-20: 150 mg via INTRAMUSCULAR

## 2018-07-20 NOTE — Assessment & Plan Note (Signed)
Blood pressure is elevated, increasing Exforge to 10/320. We can recheck the blood pressure at the follow-up nurse visit, because his pulse was relatively high I would probably add a beta-blocker such as carvedilol at the follow-up visit for his testosterone.

## 2018-07-20 NOTE — Progress Notes (Signed)
Patient came into office today for testosterone injection. Denies chest pain, shortness of breath, headaches and problems associated with taking this medication. Patient states he has had no abnornal mood swings. Patient tolerated injection in LUOQ well without complications. Patient advised to schedule his next injection for 2 weeks from today.  Patient's bp was elevated today. Denies chest pain, dizziness, SOB, headache. Reports taking his Rx this am around 0900. Per Physician in office, BP Rx to be increased. This can be rechecked at next NV. Patient verbalized understanding.

## 2018-07-20 NOTE — Progress Notes (Signed)
  Hypertension goal BP (blood pressure) < 130/80 Blood pressure is elevated, increasing Exforge to 10/320. We can recheck the blood pressure at the follow-up nurse visit, because his pulse was relatively high I would probably add a beta-blocker such as carvedilol at the follow-up visit for his testosterone.

## 2018-07-26 ENCOUNTER — Encounter (INDEPENDENT_AMBULATORY_CARE_PROVIDER_SITE_OTHER): Payer: Commercial Managed Care - PPO | Admitting: Physician Assistant

## 2018-07-26 DIAGNOSIS — F4322 Adjustment disorder with anxiety: Secondary | ICD-10-CM | POA: Diagnosis not present

## 2018-07-27 MED ORDER — ALPRAZOLAM 0.5 MG PO TABS: 0.5000 mg | ORAL_TABLET | Freq: Once | ORAL | 0 refills | Status: DC | PRN

## 2018-07-29 LAB — HEMOGLOBIN AND HEMATOCRIT, BLOOD
HEMATOCRIT: 47.3 % (ref 38.5–50.0)
Hemoglobin: 16.3 g/dL (ref 13.2–17.1)

## 2018-07-29 LAB — TESTOSTERONE: Testosterone: 460 ng/dL (ref 250–827)

## 2018-08-04 ENCOUNTER — Ambulatory Visit (INDEPENDENT_AMBULATORY_CARE_PROVIDER_SITE_OTHER): Payer: Commercial Managed Care - PPO | Admitting: Sports Medicine

## 2018-08-04 DIAGNOSIS — E291 Testicular hypofunction: Secondary | ICD-10-CM | POA: Diagnosis not present

## 2018-08-04 MED ORDER — TESTOSTERONE CYPIONATE 200 MG/ML IM SOLN
150.0000 mg | Freq: Once | INTRAMUSCULAR | Status: AC
  Administered 2018-08-04: 14:00:00 150 mg via INTRAMUSCULAR

## 2018-08-04 NOTE — Progress Notes (Signed)
Established Patient Office Visit  Subjective:  Patient ID: Leon Henson, male    DOB: [birth year]  Age: 48 y.o. MRN: [id number]  CC: No chief complaint on file.   HPI Leon Henson is here for a testosterone injection. Denies chest pain, shortness of breath, headaches or mood changes.   Past Medical History:  Diagnosis Date  . Anxiety   . Gout   . Hypertension   . Prediabetes 03/07/2018    Past Surgical History:  Procedure Laterality Date  . KNEE ARTHROCENTESIS Left     Family History  Problem Relation Age of Onset  . Heart failure Mother   . Heart attack Mother   . Hypertension Father     Social History   Socioeconomic History  . Marital status: Single    Spouse name: Not on file  . Number of children: Not on file  . Years of education: Not on file  . Highest education level: Not on file  Occupational History  . Not on file  Social Needs  . Financial resource strain: Not on file  . Food insecurity:    Worry: Not on file    Inability: Not on file  . Transportation needs:    Medical: Not on file    Non-medical: Not on file  Tobacco Use  . Smoking status: Never Smoker  . Smokeless tobacco: Never Used  Substance and Sexual Activity  . Alcohol use: Yes    Alcohol/week: 25.0 standard drinks    Types: 25 Cans of beer per week  . Drug use: No  . Sexual activity: Yes  Lifestyle  . Physical activity:    Days per week: Not on file    Minutes per session: Not on file  . Stress: Not on file  Relationships  . Social connections:    Talks on phone: Not on file    Gets together: Not on file    Attends religious service: Not on file    Active member of club or organization: Not on file    Attends meetings of clubs or organizations: Not on file    Relationship status: Not on file  . Intimate partner violence:    Fear of current or ex partner: Not on file    Emotionally abused: Not on file    Physically abused: Not on file    Forced sexual activity: Not on file   Other Topics Concern  . Not on file  Social History Narrative  . Not on file    Outpatient Medications Prior to Visit  Medication Sig Dispense Refill  . ALPRAZolam (XANAX) 0.5 MG tablet Take 1 tablet (0.5 mg total) by mouth once as needed for up to 1 dose for anxiety. 20 tablet 0  . amLODipine-valsartan (EXFORGE) 10-320 MG tablet Take 1 tablet by mouth daily. 30 tablet 1  . atorvastatin (LIPITOR) 20 MG tablet Take 1 tablet (20 mg total) by mouth daily. 90 tablet 3  . citalopram (CELEXA) 10 MG tablet Take 10 mg by mouth daily.    . NONFORMULARY OR COMPOUNDED ITEM Overnight pulse oximetry 1 each 0  . sildenafil (REVATIO) 20 MG tablet Take 1-5 tablets PO prn 50 tablet 11  . testosterone cypionate (DEPOTESTOSTERONE CYPIONATE) 200 MG/ML injection Inject 0.75 mLs (150 mg total) into the muscle every 14 (fourteen) days. 5 mL 0   No facility-administered medications prior to visit.     Allergies  Allergen Reactions  . Hydrochlorothiazide Other (See Comments)    ED, muscle cramps, gout  .  Lisinopril Cough    ROS Review of Systems    Objective:    Physical Exam  There were no vitals taken for this visit. Wt Readings from Last 3 Encounters:  07/07/18 247 lb (112 kg)  06/23/18 245 lb (111.1 kg)  06/09/18 248 lb (112.5 kg)     Health Maintenance Due  Topic Date Due  . HIV Screening  05/04/1984  . TETANUS/TDAP  05/04/1988    There are no preventive care reminders to display for this patient.  Lab Results  Component Value Date   TSH 1.65 03/03/2018   Lab Results  Component Value Date   WBC 4.9 03/03/2018   HGB 16.3 07/28/2018   HCT 47.3 07/28/2018   MCV 90.6 03/03/2018   PLT 269 03/03/2018   Lab Results  Component Value Date   NA 141 09/30/2017   K 4.3 09/30/2017   CO2 30 09/30/2017   GLUCOSE 112 (H) 09/30/2017   BUN 15 09/30/2017   CREATININE 1.06 09/30/2017   BILITOT 0.8 09/30/2017   AST 43 (H) 09/30/2017   ALT 44 09/30/2017   PROT 7.5 09/30/2017    CALCIUM 10.1 09/30/2017   Lab Results  Component Value Date   CHOL 266 (H) 09/30/2017   Lab Results  Component Value Date   HDL 41 09/30/2017   Lab Results  Component Value Date   LDLCALC  09/30/2017     Comment:     . LDL cholesterol not calculated. Triglyceride levels greater than 400 mg/dL invalidate calculated LDL results. . Reference range: <100 . Desirable range <100 mg/dL for primary prevention;   <70 mg/dL for patients with CHD or diabetic patients  with > or = 2 CHD risk factors. Marland Kitchen LDL-C is now calculated using the Martin-Hopkins  calculation, which is a validated novel method providing  better accuracy than the Friedewald equation in the  estimation of LDL-C.  Horald Pollen et al. Lenox Ahr. 8811;031(59): 2061-2068  (http://education.[facility].com/faq/FAQ164)    Lab Results  Component Value Date   TRIG 431 (H) 09/30/2017   Lab Results  Component Value Date   CHOLHDL 6.5 (H) 09/30/2017   Lab Results  Component Value Date   HGBA1C 5.8 (H) 03/03/2018      Assessment & Plan:  Hypogonadism - Patient tolerated injection well without complications. Patient advised to schedule next injection 14 days from today.    Problem List Items Addressed This Visit    Hypogonadism in male - Primary   Relevant Medications   testosterone cypionate (DEPOTESTOSTERONE CYPIONATE) injection 150 mg (Completed) (Start on 08/04/2018  2:15 PM)      Meds ordered this encounter  Medications  . testosterone cypionate (DEPOTESTOSTERONE CYPIONATE) injection 150 mg    Follow-up: Return in about 2 weeks (around 08/18/2018) for testosterone injection. Earna Coder, Janalyn Harder, CMA

## 2018-08-16 ENCOUNTER — Other Ambulatory Visit: Payer: Self-pay | Admitting: Sports Medicine

## 2018-08-16 DIAGNOSIS — I10 Essential (primary) hypertension: Secondary | ICD-10-CM

## 2018-08-16 NOTE — Telephone Encounter (Signed)
To PCP

## 2018-08-18 ENCOUNTER — Ambulatory Visit (INDEPENDENT_AMBULATORY_CARE_PROVIDER_SITE_OTHER): Payer: Commercial Managed Care - PPO | Admitting: Sports Medicine

## 2018-08-18 VITALS — BP 145/75 | HR 85

## 2018-08-18 DIAGNOSIS — E291 Testicular hypofunction: Secondary | ICD-10-CM | POA: Diagnosis not present

## 2018-08-18 MED ORDER — TESTOSTERONE CYPIONATE 200 MG/ML IM SOLN
150.0000 mg | Freq: Once | INTRAMUSCULAR | Status: AC
  Administered 2018-08-18: 150 mg via INTRAMUSCULAR

## 2018-08-18 NOTE — Progress Notes (Signed)
Established Patient Office Visit  Subjective:  Patient ID: Leon Henson [place], male    DOB: 07-13-1970  Age: 48 y.o. MRN: [id number]  CC:  Chief Complaint  Patient presents with  . Hypogonadism    HPI Leon Henson Mickley is here for a testosterone injection. Denies chest pain, shortness of breath, headaches or mood changes.   Past Medical History:  Diagnosis Date  . Anxiety   . Gout   . Hypertension   . Prediabetes 03/07/2018    Past Surgical History:  Procedure Laterality Date  . KNEE ARTHROCENTESIS Left     Family History  Problem Relation Age of Onset  . Heart failure Mother   . Heart attack Mother   . Hypertension Father     Social History   Socioeconomic History  . Marital status: Single    Spouse name: Not on file  . Number of children: Not on file  . Years of education: Not on file  . Highest education level: Not on file  Occupational History  . Not on file  Social Needs  . Financial resource strain: Not on file  . Food insecurity:    Worry: Not on file    Inability: Not on file  . Transportation needs:    Medical: Not on file    Non-medical: Not on file  Tobacco Use  . Smoking status: Never Smoker  . Smokeless tobacco: Never Used  Substance and Sexual Activity  . Alcohol use: Yes    Alcohol/week: 25.0 standard drinks    Types: 25 Cans of beer per week  . Drug use: No  . Sexual activity: Yes  Lifestyle  . Physical activity:    Days per week: Not on file    Minutes per session: Not on file  . Stress: Not on file  Relationships  . Social connections:    Talks on phone: Not on file    Gets together: Not on file    Attends religious service: Not on file    Active member of club or organization: Not on file    Attends meetings of clubs or organizations: Not on file    Relationship status: Not on file  . Intimate partner violence:    Fear of current or ex partner: Not on file    Emotionally abused: Not on file    Physically abused: Not on file   Forced sexual activity: Not on file  Other Topics Concern  . Not on file  Social History Narrative  . Not on file    Outpatient Medications Prior to Visit  Medication Sig Dispense Refill  . ALPRAZolam (XANAX) 0.5 MG tablet Take 1 tablet (0.5 mg total) by mouth once as needed for up to 1 dose for anxiety. 20 tablet 0  . amLODipine-valsartan (EXFORGE) 10-320 MG tablet Take 1 tablet by mouth daily. 90 tablet 0  . atorvastatin (LIPITOR) 20 MG tablet Take 1 tablet (20 mg total) by mouth daily. 90 tablet 3  . citalopram (CELEXA) 10 MG tablet Take 10 mg by mouth daily.    . NONFORMULARY OR COMPOUNDED ITEM Overnight pulse oximetry 1 each 0  . sildenafil (REVATIO) 20 MG tablet Take 1-5 tablets PO prn 50 tablet 11  . testosterone cypionate (DEPOTESTOSTERONE CYPIONATE) 200 MG/ML injection Inject 0.75 mLs (150 mg total) into the muscle every 14 (fourteen) days. 5 mL 0   No facility-administered medications prior to visit.     Allergies  Allergen Reactions  . Hydrochlorothiazide Other (See Comments)  ED, muscle cramps, gout  . Lisinopril Cough    ROS Review of Systems    Objective:    Physical Exam  BP (!) 153/81   Pulse 85   SpO2 98%  Wt Readings from Last 3 Encounters:  07/07/18 247 lb (112 kg)  06/23/18 245 lb (111.1 kg)  06/09/18 248 lb (112.5 kg)     Health Maintenance Due  Topic Date Due  . HIV Screening  05/04/1984  . TETANUS/TDAP  05/04/1988    There are no preventive care reminders to display for this patient.  Lab Results  Component Value Date   TSH 1.65 03/03/2018   Lab Results  Component Value Date   WBC 4.9 03/03/2018   HGB 16.3 07/28/2018   HCT 47.3 07/28/2018   MCV 90.6 03/03/2018   PLT 269 03/03/2018   Lab Results  Component Value Date   NA 141 09/30/2017   K 4.3 09/30/2017   CO2 30 09/30/2017   GLUCOSE 112 (H) 09/30/2017   BUN 15 09/30/2017   CREATININE 1.06 09/30/2017   BILITOT 0.8 09/30/2017   AST 43 (H) 09/30/2017   ALT 44 09/30/2017    PROT 7.5 09/30/2017   CALCIUM 10.1 09/30/2017   Lab Results  Component Value Date   CHOL 266 (H) 09/30/2017   Lab Results  Component Value Date   HDL 41 09/30/2017   Lab Results  Component Value Date   LDLCALC  09/30/2017     Comment:     . LDL cholesterol not calculated. Triglyceride levels greater than 400 mg/dL invalidate calculated LDL results. . Reference range: <100 . Desirable range <100 mg/dL for primary prevention;   <70 mg/dL for patients with CHD or diabetic patients  with > or = 2 CHD risk factors. Marland Kitchen LDL-C is now calculated using the Martin-Hopkins  calculation, which is a validated novel method providing  better accuracy than the Friedewald equation in the  estimation of LDL-C.  Horald Pollen et al. Lenox Ahr. 2130;865(78): 2061-2068  (http://education.[facility].com/faq/FAQ164)    Lab Results  Component Value Date   TRIG 431 (H) 09/30/2017   Lab Results  Component Value Date   CHOLHDL 6.5 (H) 09/30/2017   Lab Results  Component Value Date   HGBA1C 5.8 (H) 03/03/2018      Assessment & Plan:  Hypogonadism - Patient tolerated injection well without complications. Patient advised to schedule next injection 14 days from today. Patient advised to follow up with PCP if blood pressure continues to be elevated.    Problem List Items Addressed This Visit    Hypogonadism in male - Primary   Relevant Medications   testosterone cypionate (DEPOTESTOSTERONE CYPIONATE) injection 150 mg (Completed) (Start on 08/18/2018  2:45 PM)      Meds ordered this encounter  Medications  . testosterone cypionate (DEPOTESTOSTERONE CYPIONATE) injection 150 mg    Follow-up: Return in about 2 weeks (around 09/01/2018) for testosterone injection.    Esmond Harps, CMA

## 2018-08-30 ENCOUNTER — Other Ambulatory Visit: Payer: Self-pay | Admitting: Physician Assistant

## 2018-08-30 DIAGNOSIS — I10 Essential (primary) hypertension: Secondary | ICD-10-CM

## 2018-09-01 ENCOUNTER — Ambulatory Visit (INDEPENDENT_AMBULATORY_CARE_PROVIDER_SITE_OTHER): Payer: Commercial Managed Care - PPO | Admitting: Sports Medicine

## 2018-09-01 ENCOUNTER — Other Ambulatory Visit: Payer: Self-pay | Admitting: Physician Assistant

## 2018-09-01 VITALS — BP 153/72 | HR 67

## 2018-09-01 DIAGNOSIS — E291 Testicular hypofunction: Secondary | ICD-10-CM | POA: Diagnosis not present

## 2018-09-01 MED ORDER — TESTOSTERONE CYPIONATE 200 MG/ML IM SOLN
150.0000 mg | Freq: Once | INTRAMUSCULAR | Status: AC
  Administered 2018-09-01: 150 mg via INTRAMUSCULAR

## 2018-09-01 MED ORDER — AMLODIPINE BESYLATE-VALSARTAN 10-160 MG PO TABS: 1.0000 | ORAL_TABLET | Freq: Every day | ORAL | 0 refills | Status: DC

## 2018-09-01 NOTE — Progress Notes (Signed)
Patient came into office today for testosterone injection. Denies chest pain, shortness of breath, headaches and problems associated with taking this medication. Patient states he has had no abnornal mood swings. Patient tolerated injection in RUOQ well without complications. Patient advised to schedule his next injection for 2 weeks from today.  Patients BP was elevated today. Reports the pharmacy didn't have the Rx for exforge 10-320mg , so he has been taking his old dose which is 10-160mg . I called [facility] - the Rx is still on backorder. Will ask a provider in office to review. Recheck BP was good, will send in refill on old dose (10-160mg ). He will take BP at home and bring readings in with him when he comes for next T injection in 2 weeks.

## 2018-09-15 ENCOUNTER — Other Ambulatory Visit: Payer: Self-pay

## 2018-09-15 ENCOUNTER — Ambulatory Visit (INDEPENDENT_AMBULATORY_CARE_PROVIDER_SITE_OTHER): Payer: Commercial Managed Care - PPO | Admitting: Physician Assistant

## 2018-09-15 VITALS — BP 140/79 | HR 84 | Wt 250.0 lb

## 2018-09-15 DIAGNOSIS — E291 Testicular hypofunction: Secondary | ICD-10-CM | POA: Diagnosis not present

## 2018-09-15 MED ORDER — TESTOSTERONE CYPIONATE 200 MG/ML IM SOLN
150.0000 mg | Freq: Once | INTRAMUSCULAR | Status: AC
  Administered 2018-09-15: 150 mg via INTRAMUSCULAR

## 2018-09-15 NOTE — Progress Notes (Signed)
Established Patient Office Visit  Subjective:  Patient ID: Leon Henson Mcneal, male    DOB: [birth year]  Age: 48 y.o. MRN: [id number]  CC:  Chief Complaint  Patient presents with  . Hypogonadism    HPI Leon Henson Obryant is here for a testosterone injection. Denies chest pain, shortness of breath, headaches or mood changes.   Past Medical History:  Diagnosis Date  . Anxiety   . Gout   . Hypertension   . Prediabetes 03/07/2018    Past Surgical History:  Procedure Laterality Date  . KNEE ARTHROCENTESIS Left     Family History  Problem Relation Age of Onset  . Heart failure Mother   . Heart attack Mother   . Hypertension Father     Social History   Socioeconomic History  . Marital status: Single    Spouse name: Not on file  . Number of children: Not on file  . Years of education: Not on file  . Highest education level: Not on file  Occupational History  . Not on file  Social Needs  . Financial resource strain: Not on file  . Food insecurity:    Worry: Not on file    Inability: Not on file  . Transportation needs:    Medical: Not on file    Non-medical: Not on file  Tobacco Use  . Smoking status: Never Smoker  . Smokeless tobacco: Never Used  Substance and Sexual Activity  . Alcohol use: Yes    Alcohol/week: 25.0 standard drinks    Types: 25 Cans of beer per week  . Drug use: No  . Sexual activity: Yes  Lifestyle  . Physical activity:    Days per week: Not on file    Minutes per session: Not on file  . Stress: Not on file  Relationships  . Social connections:    Talks on phone: Not on file    Gets together: Not on file    Attends religious service: Not on file    Active member of club or organization: Not on file    Attends meetings of clubs or organizations: Not on file    Relationship status: Not on file  . Intimate partner violence:    Fear of current or ex partner: Not on file    Emotionally abused: Not on file    Physically abused: Not on file   Forced sexual activity: Not on file  Other Topics Concern  . Not on file  Social History Narrative  . Not on file    Outpatient Medications Prior to Visit  Medication Sig Dispense Refill  . ALPRAZolam (XANAX) 0.5 MG tablet Take 1 tablet (0.5 mg total) by mouth once as needed for up to 1 dose for anxiety. 20 tablet 0  . amLODipine-valsartan (EXFORGE) 10-160 MG tablet Take 1 tablet by mouth daily. 30 tablet 0  . atorvastatin (LIPITOR) 20 MG tablet Take 1 tablet (20 mg total) by mouth daily. 90 tablet 3  . citalopram (CELEXA) 10 MG tablet Take 10 mg by mouth daily.    . NONFORMULARY OR COMPOUNDED ITEM Overnight pulse oximetry 1 each 0  . sildenafil (REVATIO) 20 MG tablet Take 1-5 tablets PO prn 50 tablet 11  . testosterone cypionate (DEPOTESTOSTERONE CYPIONATE) 200 MG/ML injection Inject 0.75 mLs (150 mg total) into the muscle every 14 (fourteen) days. 5 mL 0   No facility-administered medications prior to visit.     Allergies  Allergen Reactions  . Hydrochlorothiazide Other (See Comments)  ED, muscle cramps, gout  . Lisinopril Cough    ROS Review of Systems    Objective:    Physical Exam  BP 140/79   Pulse 84   Wt 250 lb (113.4 kg)   SpO2 100%   BMI 33.91 kg/m  Wt Readings from Last 3 Encounters:  09/15/18 250 lb (113.4 kg)  07/07/18 247 lb (112 kg)  06/23/18 245 lb (111.1 kg)    BP Readings from Last 3 Encounters:  09/15/18 140/79  09/01/18 (!) 153/72  08/18/18 (!) 145/75    There are no preventive care reminders to display for this patient.  Lab Results  Component Value Date   TSH 1.65 03/03/2018   Lab Results  Component Value Date   WBC 4.9 03/03/2018   HGB 16.3 07/28/2018   HCT 47.3 07/28/2018   MCV 90.6 03/03/2018   PLT 269 03/03/2018   Lab Results  Component Value Date   NA 141 09/30/2017   K 4.3 09/30/2017   CO2 30 09/30/2017   GLUCOSE 112 (H) 09/30/2017   BUN 15 09/30/2017   CREATININE 1.06 09/30/2017   BILITOT 0.8 09/30/2017   AST  43 (H) 09/30/2017   ALT 44 09/30/2017   PROT 7.5 09/30/2017   CALCIUM 10.1 09/30/2017   Lab Results  Component Value Date   CHOL 266 (H) 09/30/2017   Lab Results  Component Value Date   HDL 41 09/30/2017   Lab Results  Component Value Date   LDLCALC  09/30/2017     Comment:     . LDL cholesterol not calculated. Triglyceride levels greater than 400 mg/dL invalidate calculated LDL results. . Reference range: <100 . Desirable range <100 mg/dL for primary prevention;   <70 mg/dL for patients with CHD or diabetic patients  with > or = 2 CHD risk factors. Marland Kitchen LDL-C is now calculated using the Martin-Hopkins  calculation, which is a validated novel method providing  better accuracy than the Friedewald equation in the  estimation of LDL-C.  Horald Pollen et al. Lenox Ahr. 0630;160(10): 2061-2068  (http://education.[facility].com/faq/FAQ164)    Lab Results  Component Value Date   TRIG 431 (H) 09/30/2017   Lab Results  Component Value Date   CHOLHDL 6.5 (H) 09/30/2017   Lab Results  Component Value Date   HGBA1C 5.8 (H) 03/03/2018      Assessment & Plan:  Hypogonadism - Patient tolerated injection well without complications. Patient advised to schedule next injection 14 days from today.    Problem List Items Addressed This Visit    Hypogonadism in male - Primary   Relevant Medications   testosterone cypionate (DEPOTESTOSTERONE CYPIONATE) injection 150 mg (Completed) (Start on 09/15/2018  4:00 PM)      Meds ordered this encounter  Medications  . testosterone cypionate (DEPOTESTOSTERONE CYPIONATE) injection 150 mg    Follow-up: Return in about 2 weeks (around 09/29/2018) for testosterone injection.Earna Coder, Janalyn Harder, CMA

## 2018-09-24 ENCOUNTER — Other Ambulatory Visit: Payer: Self-pay | Admitting: Physician Assistant

## 2018-09-25 ENCOUNTER — Telehealth: Payer: Self-pay | Admitting: Physician Assistant

## 2018-09-25 NOTE — Telephone Encounter (Signed)
Patient due for follow-up appointment with me Please change 6/19 nurse visit to an office visit

## 2018-09-25 NOTE — Telephone Encounter (Signed)
Switched to [employer]  schedule as requested

## 2018-09-29 ENCOUNTER — Encounter: Payer: Self-pay | Admitting: Physician Assistant

## 2018-09-29 ENCOUNTER — Ambulatory Visit (INDEPENDENT_AMBULATORY_CARE_PROVIDER_SITE_OTHER): Payer: Commercial Managed Care - PPO | Admitting: Physician Assistant

## 2018-09-29 VITALS — BP 154/80 | HR 88 | Temp 98.8°F | Wt 250.0 lb

## 2018-09-29 DIAGNOSIS — R7303 Prediabetes: Secondary | ICD-10-CM

## 2018-09-29 DIAGNOSIS — I10 Essential (primary) hypertension: Secondary | ICD-10-CM

## 2018-09-29 DIAGNOSIS — G4734 Idiopathic sleep related nonobstructive alveolar hypoventilation: Secondary | ICD-10-CM | POA: Diagnosis not present

## 2018-09-29 DIAGNOSIS — M2550 Pain in unspecified joint: Secondary | ICD-10-CM | POA: Diagnosis not present

## 2018-09-29 DIAGNOSIS — R7401 Elevation of levels of liver transaminase levels: Secondary | ICD-10-CM

## 2018-09-29 DIAGNOSIS — Z8739 Personal history of other diseases of the musculoskeletal system and connective tissue: Secondary | ICD-10-CM

## 2018-09-29 DIAGNOSIS — E291 Testicular hypofunction: Secondary | ICD-10-CM

## 2018-09-29 DIAGNOSIS — R5382 Chronic fatigue, unspecified: Secondary | ICD-10-CM

## 2018-09-29 DIAGNOSIS — E782 Mixed hyperlipidemia: Secondary | ICD-10-CM

## 2018-09-29 DIAGNOSIS — Z79890 Hormone replacement therapy (postmenopausal): Secondary | ICD-10-CM

## 2018-09-29 DIAGNOSIS — Z5181 Encounter for therapeutic drug level monitoring: Secondary | ICD-10-CM | POA: Insufficient documentation

## 2018-09-29 DIAGNOSIS — R740 Nonspecific elevation of levels of transaminase and lactic acid dehydrogenase [LDH]: Secondary | ICD-10-CM

## 2018-09-29 MED ORDER — ATORVASTATIN CALCIUM 20 MG PO TABS: 20.0000 mg | ORAL_TABLET | Freq: Every day | ORAL | 3 refills | Status: DC

## 2018-09-29 MED ORDER — AMLODIPINE BESYLATE-VALSARTAN 10-160 MG PO TABS: 1.0000 | ORAL_TABLET | Freq: Every day | ORAL | 0 refills | Status: DC

## 2018-09-29 MED ORDER — ATORVASTATIN CALCIUM 20 MG PO TABS: 10.0000 mg | ORAL_TABLET | Freq: Every day | ORAL | 3 refills | Status: DC

## 2018-09-29 MED ORDER — TESTOSTERONE CYPIONATE 200 MG/ML IM SOLN
150.0000 mg | Freq: Once | INTRAMUSCULAR | Status: AC
  Administered 2018-09-29: 150 mg via INTRAMUSCULAR

## 2018-09-29 MED ORDER — TESTOSTERONE CYPIONATE 200 MG/ML IM SOLN: 200.0000 mg | INTRAMUSCULAR | 0 refills | Status: DC

## 2018-09-29 NOTE — Progress Notes (Signed)
HPI:                                                                Leon Henson is a 48 y.o. male who presents to [facility] today for chronic disease management  HTN: taking Amlodipine-Valsartan 10-160 mg daily. Compliant with medications. Checks BP's at home. BP <130/80 at home according to patient. BP is consistently elevated in office consistent with white coat syndrome. Denies vision change, headache, chest pain with exertion, orthopnea, lightheadedness, syncope and edema. Risk factors include: male sex, obesity, HLD, family hx  HLD: taking Atorvastatin 20 mg QD. He tried reducing his dose to 10 mg to see if his fatigue/joint pain would improve, but noted no difference so he went back to 20 mg.  Hypogonadism - patient has been on testosterone replacement with testosterone cypionate Q2weeks for 6 months. Prior to starting TRT he was C/o feeling tired/sluggish, low motivation, worsening ED symptoms and weight gain. He has noticed a mild improvement in all of these symptoms, but states he is not where he would like to be. Lab Results  Component Value Date   TESTOSTERONE 460 07/28/2018    Fatigue: Typically sleeps 7 hours/night, does not feel rested. Sleep study was negative for OSA, but did show nocturnal hypoxemia. Overnight pulse ox was ordered, but not performed.  Joint pain: patient describes migratory pain and swelling of multiple joints. Pain and swelling is typically unilateral and affecting only one joint at a time, but can occur in knuckle, elbows, knees, and ankles. Pain/swelling usually lasts 1-3 days and resolves spontaneously. He states it feels like gout at times. He takes Ibuprofen 400-800 mg approx 2-3 days per week as needed for pain. He would like to to be worked up for this. He does not presently have any arthralgias today. No family hx of rheumatism.  Depression screen [facility] 2/9 09/29/2018 03/31/2018 02/28/2018 09/30/2017  06/09/2017  Decreased Interest 0 2 1 0 0  Down, Depressed, Hopeless 0 0 0 0 0  PHQ - 2 Score 0 2 1 0 0  Altered sleeping 1 2 1 1  -  Tired, decreased energy 1 3 1 1  -  Change in appetite 0 2 0 0 -  Feeling bad or failure about yourself  0 1 0 0 -  Trouble concentrating 0 0 1 0 -  Moving slowly or fidgety/restless 0 0 0 0 -  Suicidal thoughts 0 0 0 0 -  PHQ-9 Score 2 10 4 2  -  Difficult doing work/chores - Somewhat difficult Somewhat difficult Not difficult at all -   GAD 7 : Generalized Anxiety Score 03/31/2018 02/28/2018 09/30/2017  Nervous, Anxious, on Edge 1 1 1   Control/stop worrying 0 0 0  Worry too much - different things 0 0 0  Trouble relaxing 1 1 0  Restless 0 0 0  Easily annoyed or irritable 2 0 0  Afraid - awful might happen 0 0 0  Total GAD 7 Score 4 2 1   Anxiety Difficulty Somewhat difficult Somewhat difficult Not difficult at all     Past Medical History:  Diagnosis Date  . Anxiety   . Gout   . Hypertension   . Prediabetes 03/07/2018   Past Surgical History:  Procedure  Laterality Date  . KNEE ARTHROCENTESIS Left    Social History   Tobacco Use  . Smoking status: Never Smoker  . Smokeless tobacco: Never Used  Substance Use Topics  . Alcohol use: Yes    Alcohol/week: 25.0 standard drinks    Types: 25 Cans of beer per week   family history includes Heart attack in his mother; Heart failure in his mother; Hypertension in his father.    ROS: negative except as noted in the HPI  Medications: Current Outpatient Medications  Medication Sig Dispense Refill  . ALPRAZolam (XANAX) 0.5 MG tablet Take 1 tablet (0.5 mg total) by mouth once as needed for up to 1 dose for anxiety. 20 tablet 0  . amLODipine-valsartan (EXFORGE) 10-160 MG tablet Take 1 tablet by mouth daily. 90 tablet 0  . atorvastatin (LIPITOR) 20 MG tablet Take 1 tablet (20 mg total) by mouth daily. 90 tablet 3  . citalopram (CELEXA) 10 MG tablet Take 1 tablet (10 mg total) by mouth daily. Due for  follow up visit w/PCP 30 tablet 0  . ibuprofen (ADVIL) 200 MG tablet Take 400-800 mg by mouth every 6 (six) hours as needed.    . testosterone cypionate (DEPOTESTOSTERONE CYPIONATE) 200 MG/ML injection Inject 1 mL (200 mg total) into the muscle every 14 (fourteen) days. 5 mL 0   No current facility-administered medications for this visit.    Allergies  Allergen Reactions  . Hydrochlorothiazide Other (See Comments)    ED, muscle cramps, gout  . Lisinopril Cough       Objective:  BP (!) 154/80   Pulse 88   Temp 98.8 F (37.1 C) (Oral)   Wt 250 lb (113.4 kg)   BMI 33.91 kg/m  Vitals:   09/29/18 1554 09/29/18 1616  BP: (!) 157/87 (!) 154/80  Pulse: 87 88  Temp: 98.8 F (37.1 C)   Gen:  alert, not ill-appearing, no distress, appropriate for age HEENT: head normocephalic without obvious abnormality, conjunctiva and cornea clear, trachea midline Pulm: Normal work of breathing, normal phonation, clear to auscultation bilaterally  Neuro: alert and oriented x 3, no tremor MSK: extremities atraumatic, normal gait and station, no peripheral edema, no joint pain/swelling Skin: intact, no rashes on exposed skin, no jaundice, no cyanosis Psych: appearance casual, cooperative, good eye contact, euthymic mood, flat affect, speech is articulate, and thought processes clear and goal-directed  Lab Results  Component Value Date   HGB 16.3 07/28/2018   Lab Results  Component Value Date   HCT 47.3 07/28/2018   Lab Results  Component Value Date   TESTOSTERONE 460 07/28/2018   Lab Results  Component Value Date   CREATININE 1.06 09/30/2017   BUN 15 09/30/2017   NA 141 09/30/2017   K 4.3 09/30/2017   CL 101 09/30/2017   CO2 30 09/30/2017   Lab Results  Component Value Date   ALT 44 09/30/2017   AST 43 (H) 09/30/2017   BILITOT 0.8 09/30/2017   Lab Results  Component Value Date   CHOL 266 (H) 09/30/2017   HDL 41 09/30/2017   LDLCALC  09/30/2017     Comment:     . LDL  cholesterol not calculated. Triglyceride levels greater than 400 mg/dL invalidate calculated LDL results. . Reference range: <100 . Desirable range <100 mg/dL for primary prevention;   <70 mg/dL for patients with CHD or diabetic patients  with > or = 2 CHD risk factors. Marland Kitchen. LDL-C is now calculated using the Martin-Hopkins  calculation, which  is a validated novel method providing  better accuracy than the Friedewald equation in the  estimation of LDL-C.  Horald PollenMartin SS et al. Lenox AhrJAMA. 1610;960(452013;310(19): 2061-2068  (http://education.[facility].com/faq/FAQ164)    LDLDIRECT 142 (H) 09/30/2017   TRIG 431 (H) 09/30/2017   CHOLHDL 6.5 (H) 09/30/2017   The 10-year ASCVD risk score Denman George(Goff DC Jr., et al., 2013) is: 9.4%   Values used to calculate the score:     Age: [age] years     Sex: Male     Is Non-Hispanic African American: No     Diabetic: No     Tobacco smoker: No     Systolic Blood Pressure: 154 mmHg     Is BP treated: Yes     HDL Cholesterol: 41 mg/dL     Total Cholesterol: 266 mg/dL    Assessment and Plan: 48 y.o. male with   .Leon Henson was seen today for hypertension and hypogonadism.  Diagnoses and all orders for this visit:  Hypogonadism in male -     testosterone cypionate (DEPOTESTOSTERONE CYPIONATE) injection 150 mg -     testosterone cypionate (DEPOTESTOSTERONE CYPIONATE) 200 MG/ML injection; Inject 1 mL (200 mg total) into the muscle every 14 (fourteen) days. -     Testosterone Total,Free,Bio, Males; Future -     Hemoglobin and hematocrit, blood; Future  Nocturnal hypoxemia -     Cancel: Pulse oximetry, overnight -     Nocturnal polysomnography (NPSG)  Hypertension goal BP (blood pressure) < 130/80 -     amLODipine-valsartan (EXFORGE) 10-160 MG tablet; Take 1 tablet by mouth daily.  Arthralgia of multiple sites -     ANA -     Sedimentation rate -     Rheumatoid factor -     Uric acid -     C-reactive protein -     CK -     COMPLETE METABOLIC PANEL WITH GFR -      CBC with Differential/Platelet  Chronic fatigue -     ANA -     Sedimentation rate -     Rheumatoid factor -     Uric acid -     C-reactive protein -     CK -     COMPLETE METABOLIC PANEL WITH GFR -     CBC with Differential/Platelet -     TSH + free T4  Prediabetes -     Hemoglobin A1c  Mixed hyperlipidemia -     Discontinue: atorvastatin (LIPITOR) 20 MG tablet; Take 0.5 tablets (10 mg total) by mouth daily. -     atorvastatin (LIPITOR) 20 MG tablet; Take 1 tablet (20 mg total) by mouth daily.  Encounter for monitoring testosterone replacement therapy -     Testosterone Total,Free,Bio, Males; Future -     Hemoglobin and hematocrit, blood; Future  Elevated blood pressure reading in office with white coat syndrome, with diagnosis of hypertension  History of gout -     Uric acid  Transaminitis -     COMPLETE METABOLIC PANEL WITH GFR   HTN - BP out of range in office. Known white coat syndrome. Patient reports home readings are in range. Since we are increasing his testosterone level, advised patient to monitor home BP closely and contact me for BP >130/80  HLD - 10 yr ASCVD risk 9.4%. Cont Atorvastatin 20 mg QD. If CK is elevated, will reduce dose or consider Livalo  Hypogonadism - testosterone goal 500-600. He was slightly below goal and still mildly symptomatic. Increasing  testosterone cypionate to 200 mg Q2weeks. 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 Advised patient to return for lab draw on a day when he is symptomatic (visibly swollen/painful joint)  Patient education and anticipatory guidance given Patient agrees with treatment plan Follow-up in 6 months or sooner as needed if symptoms worsen or fail to improve  Levonne Hubertharley E. Micca Matura PA-C

## 2018-09-29 NOTE — Patient Instructions (Signed)

## 2018-10-03 ENCOUNTER — Other Ambulatory Visit: Payer: Self-pay | Admitting: Physician Assistant

## 2018-10-03 DIAGNOSIS — I10 Essential (primary) hypertension: Secondary | ICD-10-CM

## 2018-10-09 ENCOUNTER — Other Ambulatory Visit: Payer: Self-pay | Admitting: Physician Assistant

## 2018-10-10 MED ORDER — CITALOPRAM HYDROBROMIDE 10 MG PO TABS: 10.0000 mg | ORAL_TABLET | Freq: Every day | ORAL | 1 refills | Status: DC

## 2018-10-10 NOTE — Addendum Note (Signed)
Addended by: Narda Rutherford on: 10/10/2018 07:33 AM   Modules accepted: Orders

## 2018-10-16 ENCOUNTER — Ambulatory Visit: Payer: Commercial Managed Care - PPO

## 2018-10-20 ENCOUNTER — Encounter: Payer: Self-pay | Admitting: Physician Assistant

## 2018-10-20 ENCOUNTER — Ambulatory Visit (INDEPENDENT_AMBULATORY_CARE_PROVIDER_SITE_OTHER): Payer: Commercial Managed Care - PPO | Admitting: Physician Assistant

## 2018-10-20 ENCOUNTER — Ambulatory Visit: Payer: Commercial Managed Care - PPO

## 2018-10-20 ENCOUNTER — Other Ambulatory Visit: Payer: Self-pay

## 2018-10-20 VITALS — BP 148/79 | HR 92 | Temp 98.3°F

## 2018-10-20 DIAGNOSIS — M25441 Effusion, right hand: Secondary | ICD-10-CM | POA: Diagnosis not present

## 2018-10-20 DIAGNOSIS — Z8739 Personal history of other diseases of the musculoskeletal system and connective tissue: Secondary | ICD-10-CM

## 2018-10-20 DIAGNOSIS — E291 Testicular hypofunction: Secondary | ICD-10-CM | POA: Diagnosis not present

## 2018-10-20 DIAGNOSIS — I10 Essential (primary) hypertension: Secondary | ICD-10-CM | POA: Diagnosis not present

## 2018-10-20 MED ORDER — INDOMETHACIN 25 MG PO CAPS: ORAL_CAPSULE | ORAL | 1 refills | Status: DC

## 2018-10-20 MED ORDER — PREDNISONE 50 MG PO TABS: ORAL_TABLET | ORAL | 0 refills | Status: DC

## 2018-10-20 MED ORDER — VALSARTAN 80 MG PO TABS: 80.0000 mg | ORAL_TABLET | Freq: Every day | ORAL | 0 refills | Status: DC

## 2018-10-20 MED ORDER — TESTOSTERONE CYPIONATE 200 MG/ML IM SOLN
200.0000 mg | Freq: Once | INTRAMUSCULAR | Status: AC
  Administered 2018-10-20: 200 mg via INTRAMUSCULAR

## 2018-10-20 NOTE — Patient Instructions (Signed)
Gout  Gout is a condition that causes painful swelling of the joints. Gout is a type of inflammation of the joints (arthritis). This condition is caused by having too much uric acid in the body. Uric acid is a chemical that forms when the body breaks down substances called purines. Purines are important for building body proteins. When the body has too much uric acid, sharp crystals can form and build up inside the joints. This causes pain and swelling. Gout attacks can happen quickly and may be very painful (acute gout). Over time, the attacks can affect more joints and become more frequent (chronic gout). Gout can also cause uric acid to build up under the skin and inside the kidneys. What are the causes? This condition is caused by too much uric acid in your blood. This can happen because:  Your kidneys do not remove enough uric acid from your blood. This is the most common cause.  Your body makes too much uric acid. This can happen with some cancers and cancer treatments. It can also occur if your body is breaking down too many red blood cells (hemolytic anemia).  You eat too many foods that are high in purines. These foods include organ meats and some seafood. Alcohol, especially beer, is also high in purines. A gout attack may be triggered by trauma or stress. What increases the risk? You are more likely to develop this condition if you:  Have a family history of gout.  Are male and middle-aged.  Are male and have gone through menopause.  Are obese.  Frequently drink alcohol, especially beer.  Are dehydrated.  Lose weight too quickly.  Have an organ transplant.  Have lead poisoning.  Take certain medicines, including aspirin, cyclosporine, diuretics, levodopa, and niacin.  Have kidney disease.  Have a skin condition called psoriasis. What are the signs or symptoms? An attack of acute gout happens quickly. It usually occurs in just one joint. The most common place is  the big toe. Attacks often start at night. Other joints that may be affected include joints of the feet, ankle, knee, fingers, wrist, or elbow. Symptoms of this condition may include:  Severe pain.  Warmth.  Swelling.  Stiffness.  Tenderness. The affected joint may be very painful to touch.  Shiny, red, or purple skin.  Chills and fever. Chronic gout may cause symptoms more frequently. More joints may be involved. You may also have white or yellow lumps (tophi) on your hands or feet or in other areas near your joints. How is this diagnosed? This condition is diagnosed based on your symptoms, medical history, and physical exam. You may have tests, such as:  Blood tests to measure uric acid levels.  Removal of joint fluid with a thin needle (aspiration) to look for uric acid crystals.  X-rays to look for joint damage. How is this treated? Treatment for this condition has two phases: treating an acute attack and preventing future attacks. Acute gout treatment may include medicines to reduce pain and swelling, including:  NSAIDs.  Steroids. These are strong anti-inflammatory medicines that can be taken by mouth (orally) or injected into a joint.  Colchicine. This medicine relieves pain and swelling when it is taken soon after an attack. It can be given by mouth or through an IV. Preventive treatment may include:  Daily use of smaller doses of NSAIDs or colchicine.  Use of a medicine that reduces uric acid levels in your blood.  Changes to your diet. You may   need to see a dietitian about what to eat and drink to prevent gout. Follow these instructions at home: During a gout attack   If directed, put ice on the affected area: ? Put ice in a plastic bag. ? Place a towel between your skin and the bag. ? Leave the ice on for 20 minutes, 2-3 times a day.  Raise (elevate) the affected joint above the level of your heart as often as possible.  Rest the joint as much as possible.  If the affected joint is in your leg, you may be given crutches to use.  Follow instructions from your health care provider about eating or drinking restrictions. Avoiding future gout attacks  Follow a low-purine diet as told by your dietitian or health care provider. Avoid foods and drinks that are high in purines, including liver, kidney, anchovies, asparagus, herring, mushrooms, mussels, and beer.  Maintain a healthy weight or lose weight if you are overweight. If you want to lose weight, talk with your health care provider. It is important that you do not lose weight too quickly.  Start or maintain an exercise program as told by your health care provider. Eating and drinking  Drink enough fluids to keep your urine pale yellow.  If you drink alcohol: ? Limit how much you use to:  0-1 drink a day for women.  0-2 drinks a day for men. ? Be aware of how much alcohol is in your drink. In the [place] one drink equals one 12 oz bottle of beer (355 mL) one 5 oz glass of wine (148 mL), or one 1 oz glass of hard liquor (44 mL). General instructions  Take over-the-counter and prescription medicines only as told by your health care provider.  Do not drive or use heavy machinery while taking prescription pain medicine.  Return to your normal activities as told by your health care provider. Ask your health care provider what activities are safe for you.  Keep all follow-up visits as told by your health care provider. This is important. Contact a health care provider if you have:  Another gout attack.  Continuing symptoms of a gout attack after 10 days of treatment.  Side effects from your medicines.  Chills or a fever.  Burning pain when you urinate.  Pain in your lower back or belly. Get help right away if you:  Have severe or uncontrolled pain.  Cannot urinate. Summary  Gout is painful swelling of the joints caused by inflammation.  The most common site of pain is the big  toe, but it can affect other joints in the body.  Medicines and dietary changes can help to prevent and treat gout attacks. This information is not intended to replace advice given to you by your health care provider. Make sure you discuss any questions you have with your health care provider. Document Released: 03/26/2000 Document Revised: 10/19/2017 Document Reviewed: 10/19/2017 Elsevier Patient Education  2020 [employer]  

## 2018-10-20 NOTE — Progress Notes (Signed)
HPI:                                                                Leon Henson is a 48 y.o. male who presents to [facility]: [place] today for right hand pain/swelling  Onset 1 week ago, gradually worsening Endorses decreased ROM 2/2 pain/swelling. Denies fever, malaise, warmth overlying the joints. He has taken several low doses of Prednisone 20 mg without relief.  Endorses hx of gout, last flare approx 1.5 years ago. He has had gout affect this hand before. Prednisone is typically effective.  Past Medical History:  Diagnosis Date  . Anxiety   . Gout   . Hypertension   . Prediabetes 03/07/2018   Past Surgical History:  Procedure Laterality Date  . KNEE ARTHROCENTESIS Left    Social History   Tobacco Use  . Smoking status: Never Smoker  . Smokeless tobacco: Never Used  Substance Use Topics  . Alcohol use: Yes    Alcohol/week: 25.0 standard drinks    Types: 25 Cans of beer per week   family history includes Heart attack in his mother; Heart failure in his mother; Hypertension in his father.    ROS: negative except as noted in the HPI  Medications: Current Outpatient Medications  Medication Sig Dispense Refill  . ALPRAZolam (XANAX) 0.5 MG tablet Take 1 tablet (0.5 mg total) by mouth once as needed for up to 1 dose for anxiety. 20 tablet 0  . amLODipine-valsartan (EXFORGE) 10-160 MG tablet Take 1 tablet by mouth daily. 90 tablet 0  . atorvastatin (LIPITOR) 20 MG tablet Take 1 tablet (20 mg total) by mouth daily. 90 tablet 3  . citalopram (CELEXA) 10 MG tablet Take 1 tablet (10 mg total) by mouth daily. 90 tablet 1  . ibuprofen (ADVIL) 200 MG tablet Take 400-800 mg by mouth every 6 (six) hours as needed.    . testosterone cypionate (DEPOTESTOSTERONE CYPIONATE) 200 MG/ML injection Inject 1 mL (200 mg total) into the muscle every 14 (fourteen) days. 5 mL 0  . indomethacin (INDOCIN) 25 MG capsule Start: 50 mg orally three times daily  for 2-3 days. Then: 25 mg orally three times daily for 4-10 days 90 capsule 1  . predniSONE (DELTASONE) 50 MG tablet One tab PO daily for 5 days. 5 tablet 0  . valsartan (DIOVAN) 80 MG tablet Take 1 tablet (80 mg total) by mouth at bedtime for 7 days. While taking Prednisone 7 tablet 0   No current facility-administered medications for this visit.    Allergies  Allergen Reactions  . Hydrochlorothiazide Other (See Comments)    ED, muscle cramps, gout  . Lisinopril Cough       Objective:  BP (!) 148/79   Pulse 92   Temp 98.3 F (36.8 C) (Oral)  Vitals:   10/20/18 1306 10/20/18 1323  BP: (!) 151/78 (!) 148/79  Pulse: 92   Temp: 98.3 F (36.8 C)     Gen:  alert, not ill-appearing, no distress, appropriate for age HEENT: head normocephalic without obvious abnormality, conjunctiva and cornea clear, trachea midline Pulm: Normal work of breathing, normal phonation, clear to auscultation bilaterally, no wheezes, rales or rhonchi CV: Normal rate, regular rhythm, s1 and s2 distinct, no murmurs, clicks  or rubs  Neuro: alert and oriented x 3, no tremor MSK: extremities atraumatic, normal gait and station Right hand: diffusely swollen, particularly over the 2nd MCP with overlying redness, decreased ROM    Lab Results  Component Value Date   CREATININE 1.06 09/30/2017   BUN 15 09/30/2017   NA 141 09/30/2017   K 4.3 09/30/2017   CL 101 09/30/2017   CO2 30 09/30/2017   No results found for: LABURIC   No results found for this or any previous visit (from the past 72 hour(s)). No results found.    Assessment and Plan: 48 y.o. male with   .Diagnoses and all orders for this visit:  Swelling of joint of right hand -     indomethacin (INDOCIN) 25 MG capsule; Start: 50 mg orally three times daily for 2-3 days. Then: 25 mg orally three times daily for 4-10 days -     predniSONE (DELTASONE) 50 MG tablet; One tab PO daily for 5 days.  History of gout  Hypertension goal BP  (blood pressure) < 130/80  Elevated blood pressure reading in office with diagnosis of hypertension -     valsartan (DIOVAN) 80 MG tablet; Take 1 tablet (80 mg total) by mouth at bedtime for 7 days. While taking Prednisone  Hypogonadism in male -     testosterone cypionate (DEPOTESTOSTERONE CYPIONATE) injection 200 mg   Patient afebrile, not-ill appearing, no tachycardia, BP in stage 2 hypertensive range Suspect recurrent gout Uric acid, renal function and CBC pending Prednisone 50 mg burst x 5 days Indomethacin for future/recurrent attacks Patient counseled not to combine the two medications BP out of range in office today. Encouraged to self-monitor BP at home. Adding an evening dose of Valsartan 80 mg for 1 week in addition to Exforge for BP control while taking steroid  Testosterone given in right gluteus today  Patient education and anticipatory guidance given Patient agrees with treatment plan Follow-up in 2 weeks for nurse BP check or sooner as needed if symptoms worsen or fail to improve  Levonne Hubertharley E. Emmaus Brandi PA-C

## 2018-10-23 ENCOUNTER — Encounter: Payer: Self-pay | Admitting: Physician Assistant

## 2018-10-23 DIAGNOSIS — M25441 Effusion, right hand: Secondary | ICD-10-CM

## 2018-10-23 DIAGNOSIS — E790 Hyperuricemia without signs of inflammatory arthritis and tophaceous disease: Secondary | ICD-10-CM

## 2018-10-23 HISTORY — DX: Hyperuricemia without signs of inflammatory arthritis and tophaceous disease: E79.0

## 2018-10-23 LAB — COMPLETE METABOLIC PANEL WITH GFR
AG Ratio: 1.7 (calc) (ref 1.0–2.5)
ALT: 23 U/L (ref 9–46)
AST: 27 U/L (ref 10–40)
Albumin: 4.3 g/dL (ref 3.6–5.1)
Alkaline phosphatase (APISO): 79 U/L (ref 36–130)
BUN: 17 mg/dL (ref 7–25)
CO2: 28 mmol/L (ref 20–32)
Calcium: 9.3 mg/dL (ref 8.6–10.3)
Chloride: 106 mmol/L (ref 98–110)
Creat: 0.89 mg/dL (ref 0.60–1.35)
GFR, Est African American: 116 mL/min/{1.73_m2} (ref >=60)
GFR, Est Non African American: 100 mL/min/{1.73_m2} (ref >=60)
Globulin: 2.6 g/dL (calc) (ref 1.9–3.7)
Glucose, Bld: 116 mg/dL — ABNORMAL HIGH (ref 65–99)
Potassium: 4.1 mmol/L (ref 3.5–5.3)
Sodium: 143 mmol/L (ref 135–146)
Total Bilirubin: 0.5 mg/dL (ref 0.2–1.2)
Total Protein: 6.9 g/dL (ref 6.1–8.1)

## 2018-10-23 LAB — URIC ACID: Uric Acid, Serum: 10.1 mg/dL — ABNORMAL HIGH (ref 4.0–8.0)

## 2018-10-23 LAB — CBC WITH DIFFERENTIAL/PLATELET
Absolute Monocytes: 890 cells/uL (ref 200–950)
Basophils Absolute: 60 cells/uL (ref 0–200)
Basophils Relative: 0.6 %
Eosinophils Absolute: 150 cells/uL (ref 15–500)
Eosinophils Relative: 1.5 %
HCT: 47 % (ref 38.5–50.0)
Hemoglobin: 15.9 g/dL (ref 13.2–17.1)
Lymphs Abs: 2750 cells/uL (ref 850–3900)
MCH: 31.3 pg (ref 27.0–33.0)
MCHC: 33.8 g/dL (ref 32.0–36.0)
MCV: 92.5 fL (ref 80.0–100.0)
MPV: 10 fL (ref 7.5–12.5)
Monocytes Relative: 8.9 %
Neutro Abs: 6150 cells/uL (ref 1500–7800)
Neutrophils Relative %: 61.5 %
Platelets: 325 10*3/uL (ref 140–400)
RBC: 5.08 10*6/uL (ref 4.20–5.80)
RDW: 12.4 % (ref 11.0–15.0)
Total Lymphocyte: 27.5 %
WBC: 10 10*3/uL (ref 3.8–10.8)

## 2018-10-23 LAB — C-REACTIVE PROTEIN: CRP: 6.6 mg/L (ref <8.0)

## 2018-10-23 LAB — CK: Total CK: 143 U/L (ref 44–196)

## 2018-10-23 LAB — ANA: Anti Nuclear Antibody (ANA): NEGATIVE

## 2018-10-23 LAB — RHEUMATOID FACTOR: Rheumatoid fact SerPl-aCnc: <14 IU/mL (ref <14)

## 2018-10-23 LAB — HEMOGLOBIN A1C
Hgb A1c MFr Bld: 6 % of total Hgb — ABNORMAL HIGH (ref <5.7)
Mean Plasma Glucose: 126 (calc)
eAG (mmol/L): 7 (calc)

## 2018-10-23 LAB — TSH+FREE T4: TSH W/REFLEX TO FT4: 1.74 mIU/L (ref 0.40–4.50)

## 2018-10-23 LAB — SEDIMENTATION RATE: Sed Rate: 11 mm/h (ref 0–15)

## 2018-10-23 MED ORDER — FEBUXOSTAT 40 MG PO TABS: 40.0000 mg | ORAL_TABLET | Freq: Every day | ORAL | 0 refills | Status: DC

## 2018-10-25 MED ORDER — PREDNISONE 10 MG (48) PO TBPK: ORAL_TABLET | Freq: Every day | ORAL | 0 refills | Status: DC

## 2018-10-25 NOTE — Addendum Note (Signed)
Addended by: Nelson Chimes E on: 10/25/2018 11:44 AM   Modules accepted: Orders

## 2018-10-27 ENCOUNTER — Ambulatory Visit (INDEPENDENT_AMBULATORY_CARE_PROVIDER_SITE_OTHER): Payer: Commercial Managed Care - PPO | Admitting: Family Medicine

## 2018-10-27 ENCOUNTER — Other Ambulatory Visit: Payer: Self-pay

## 2018-10-27 ENCOUNTER — Encounter: Payer: Self-pay | Admitting: Family Medicine

## 2018-10-27 VITALS — BP 142/84 | HR 86 | Temp 98.4°F | Wt 246.0 lb

## 2018-10-27 DIAGNOSIS — M1A9XX1 Chronic gout, unspecified, with tophus (tophi): Secondary | ICD-10-CM | POA: Diagnosis not present

## 2018-10-27 DIAGNOSIS — M7022 Olecranon bursitis, left elbow: Secondary | ICD-10-CM

## 2018-10-27 DIAGNOSIS — M7021 Olecranon bursitis, right elbow: Secondary | ICD-10-CM

## 2018-10-27 MED ORDER — COLCHICINE 0.6 MG PO TABS: ORAL_TABLET | ORAL | 2 refills | Status: DC

## 2018-10-27 MED ORDER — DICLOFENAC SODIUM 1 % TD GEL: 2.0000 g | Freq: Four times a day (QID) | TRANSDERMAL | 11 refills | Status: DC

## 2018-10-27 NOTE — Progress Notes (Signed)
Subjective:    I'm seeing this patient as a consultation for:  Carlis StableCummings, Leon Elizabeth, PA-C   CC: Right hand pain  HPI: Patient was seen by PCP about 2 weeks ago for right hand pain and swelling.  Symptoms were consistent previous episodes of gout.  He had been previously treated with prednisone off and on in the past and had tried it again but notes that it was not effective this time.  His PCP treated him with indomethacin as well as higher dose prednisone burst.  Uric acid was 10.1 on July 10th.  He notes continued pain and swelling.  He notes in the past he had basically no gout flares when he was taking Uloric but he stopped it.  Uloric was restarted on July 10 during last visit with PCP.  He notes indomethacin and prednisone have not been very helpful.  His pain is mostly located at the right fifth MCP.  Is associated with swelling and redness.  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  Additionally he notes occasional olecranon bursal swelling bilaterally left worse than right.  He notes that is pretty minimal right now is happy with how things are going.  He notes he sometimes bumps his elbows and is thinking about getting some elbow pads for work.  Past medical history, Surgical history, Family history not pertinant except as noted below, Social history, Allergies, and medications have been entered into the medical record, reviewed, and no changes needed.   Review of Systems: No headache, visual changes, nausea, vomiting, diarrhea, constipation, dizziness, abdominal pain, skin rash, fevers, chills, night sweats, weight loss, swollen lymph nodes, body aches, joint swelling, muscle aches, chest pain, shortness of breath, mood changes, visual or auditory hallucinations.   Objective:    Vitals:   10/27/18 0914  BP: (!) 142/84  Pulse: 86  Temp: 98.4 F (36.9 C)   General: Well Developed, well nourished, and in no acute distress.   Neuro/Psych: Alert and oriented x3, extra-ocular muscles intact, able to move all 4 extremities, sensation grossly intact. Skin: Warm and dry, no rashes noted.  Respiratory: Not using accessory muscles, speaking in full sentences, trachea midline.  Cardiovascular: Pulses palpable, no extremity edema. Abdomen: Does not appear distended. MSK:  Right hand significant swelling and redness at fifth MCP.  No rotational deformity.  Pulses cap refill and sensation are intact.  Tender palpation fifth MCP.  Slight decreased range of motion.  Elbows bilaterally have very minimal swelling at olecranon prominence.  Soft nontender.  No erythema.  Normal elbow motion.  Lab and Radiology Results Limited musculoskeletal ultrasound right hand dorsal fifth MCP. Normal-appearing joint structures however large cystic structure at dorsal side of the joint with starry appearance typically consistent with tophi present.  Bony structures and tendinous structures otherwise normal. Impression gout flare with tophi  Procedure: Real-time Ultrasound Guided Injection of right fifth MCP Device: GE Logiq E   Images permanently stored and available for review in the ultrasound unit. Verbal informed consent obtained.  Discussed risks and benefits of procedure. Warned about infection bleeding damage to structures skin hypopigmentation and fat atrophy among others. Patient expresses understanding and agreement Time-out conducted.   Noted no overlying erythema, induration, or other signs of local infection.   Skin prepped in a sterile fashion.   Local anesthesia: Topical Ethyl chloride.   With sterile technique and under real time ultrasound guidance:  40 mg of Depo-Medrol and 0.5 mL of lidocaine  injected easily.   Completed without difficulty   Pain immediately resolved suggesting accurate placement of the medication.   Advised to call if fevers/chills, erythema, induration, drainage, or persistent bleeding.   Images  permanently stored and available for review in the ultrasound unit.  Impression: Technically successful ultrasound guided injection.      Impression and Recommendations:    Assessment and Plan: 48 y.o. male with gout right hand.  Patient has elevated uric acid and gout flare.  Ultrasound consistent with tophi.  Plan to treat with.  Steroid injection as above.  Will treat with colchicine also as pain is not controlled very well with prednisone and indomethacin.  Additionally we will switch to prophylactic colchicine for the next month or 2 while starting on Uloric.  Agree with Uloric.  Looks like patient is set up for repeat uric acid level in 1 month.  Likely will need 80 mg of Uloric.  Also use diclofenac gel.  Recheck back with me as needed.  Elbow: Patient has olecranon bursitis mildly elbows bilaterally.  Not worth injected today.  Treat with diclofenac gel and elbow pads.  If worsening return to clinic for aspiration and injection.  PDMP not reviewed this encounter. No orders of the defined types were placed in this encounter.  Meds ordered this encounter  Medications  . colchicine 0.6 MG tablet    Sig: Take 1-2x daily for gout prevention and treatment.    Dispense:  60 tablet    Refill:  2  . diclofenac sodium (VOLTAREN) 1 % GEL    Sig: Apply 2 g topically 4 (four) times daily. To affected joint.    Dispense:  100 g    Refill:  11    Discussed warning signs or symptoms. Please see discharge instructions. Patient expresses understanding.

## 2018-10-27 NOTE — Patient Instructions (Addendum)
Thank you for coming in today. Take colchicine once daily for 1-3 months.  Ok to take 2x daily for flair ups for a few days.  Continue uloric.  STOP prednisone.  Ok to take indomethacin if needed.   For elbows apply diclofenac gel 4x daily as needed.  Use elbow pad at work.  If the swelling worsens we can remove the fluid and inject it.    Elbow Bursitis Rehab Ask your health care provider which exercises are safe for you. Do exercises exactly as told by your health care provider and adjust them as directed. It is normal to feel mild stretching, pulling, tightness, or discomfort as you do these exercises. Stop right away if you feel sudden pain or your pain gets worse. Do not begin these exercises until told by your health care provider. Stretching and range-of-motion exercises These exercises warm up your muscles and joints and improve the movement and flexibility of your elbow. The exercises also help to relieve pain and swelling. Elbow flexion, assisted 1. Stand or sit with your left / right arm at your side. 2. Use your other hand to gently push your left / right hand toward your shoulder (assisted) while bending your elbow (flexion). 3. Hold this position for __________ seconds. 4. Slowly return your left / right arm to the starting position. Repeat __________ times. Complete this exercise __________ times a day. Elbow extension, assisted 1. Lie on your back in a comfortable position that allows you to relax your arm muscles. 2. Place a folded towel under your left / right upper arm so that your elbow and shoulder are at the same height. 3. Use your other arm to raise your left / right arm (assisted) until your elbow and hand do not rest on the bed or towel. Hold your left / right arm out straight with your other hand supporting it. 4. Let the weight of your hand straighten your elbow (extension). You should feel a stretch on the inside of your elbow. ? Keep your arm and chest muscles  relaxed. ? If directed, add a small wrist weight or hand weight to increase the stretch. 5. Hold this position for __________ seconds. 6. Slowly release the stretch and return to the starting position. Repeat __________ times. Complete this exercise __________ times a day. Elbow flexion, active 1. Stand or sit with your left / right elbow bent and your palm facing in, toward your body. 2. Bend your elbow as far as you can using only your arm muscles (active flexion). 3. Hold this position for __________ seconds. 4. Slowly return to the starting position. Repeat __________ times. Complete this exercise __________ times a day. Elbow extension, active 1. Stand or sit with your left / right elbow bent and your palm facing in, toward your body. 2. Slowly straighten your elbow using only your arm muscles (active extension). Stop when you feel a gentle stretch at the front of your arm, or when your arm is straight. 3. Hold this position for __________ seconds. 4. Slowly return to the starting position. Repeat __________ times. Complete this exercise __________ times a day. Strengthening exercises These exercises build strength and endurance in your elbow. Endurance is the ability to use your muscles for a long time, even after they get tired. Elbow flexion, isometric 1. Stand or sit with your left / right arm at waist height. Your palm should face in, toward your body. 2. Place your other hand on top of your left / right forearm.  Gently push down while you resist with your left / right arm (isometric flexion). ? Use about 50% effort with both arms. You may be instructed to use more and more effort with your arms each week. ? Try not to let your left / right arm move during the exercise. 3. Hold this position for __________ seconds. 4. Let your muscles relax completely before you repeat the exercise. Repeat __________ times. Complete this exercise __________ times a day. Elbow extension,  isometric  1. Stand or sit with your left / right arm at waist height. Your palm should face in, toward your body. 2. Place your other hand on the bottom of your left / right forearm. Gently push up while you resist with your left / right arm (isometric extension). ? Use about 50% effort with both arms. You may be instructed to use more and more effort with your arms each week. ? Try not to let your left / right arm move during the exercise. 3. Hold this position for __________ seconds. 4. Let your muscles relax completely before you repeat the exercise. Repeat __________ times. Complete this exercise __________ times a day. Biceps curls 1. Sit on a stable chair without armrests, or stand up. 2. Hold a _________ weight in your left / right hand. Your palm should face out, away from your body, at the starting position. 3. Bend your left / right elbow and move your hand up toward your shoulder. Keep your elbow at your side while you bend it. 4. Slowly return to the starting position. Repeat __________ times. Complete this exercise __________ times a day. Triceps curls  1. Lie on your back. 2. Hold a _________ weight in your left / right hand. 3. Bend your left / right elbow to a 90-degree angle (right angle), so the weight is in front of your face, over your chest, and your elbow is pointed up to the ceiling. 4. Straighten your elbow, raising your hand toward the ceiling. Use your other hand to support your left / right upper arm and to keep it still. 5. Slowly return to the starting position. Repeat __________ times. Complete this exercise __________ times a day. This information is not intended to replace advice given to you by your health care provider. Make sure you discuss any questions you have with your health care provider. Document Released: 03/29/2005 Document Revised: 07/20/2018 Document Reviewed: 04/19/2018 Elsevier Patient Education  2020 [employer]

## 2018-11-03 ENCOUNTER — Other Ambulatory Visit: Payer: Self-pay

## 2018-11-03 ENCOUNTER — Ambulatory Visit (INDEPENDENT_AMBULATORY_CARE_PROVIDER_SITE_OTHER): Payer: Commercial Managed Care - PPO | Admitting: Sports Medicine

## 2018-11-03 VITALS — BP 142/87 | HR 87 | Temp 98.4°F | Wt 245.0 lb

## 2018-11-03 DIAGNOSIS — E291 Testicular hypofunction: Secondary | ICD-10-CM | POA: Diagnosis not present

## 2018-11-03 MED ORDER — TESTOSTERONE CYPIONATE 200 MG/ML IM SOLN
200.0000 mg | Freq: Once | INTRAMUSCULAR | Status: AC
  Administered 2018-11-03: 200 mg via INTRAMUSCULAR

## 2018-11-03 NOTE — Progress Notes (Signed)
Pt in today for testosterone injection. Medications and allergies reviewed. Pt denies chest pain, shortness of breath, headaches and denies problems with medication or mood changes. Pt received testosterone in 200mg  in LUOQ . Patient tolerated injection well without complications. Pt advised to schedule next injection in 14 days.

## 2018-11-14 ENCOUNTER — Other Ambulatory Visit: Payer: Self-pay | Admitting: Physician Assistant

## 2018-11-17 ENCOUNTER — Ambulatory Visit (INDEPENDENT_AMBULATORY_CARE_PROVIDER_SITE_OTHER): Payer: Commercial Managed Care - PPO | Admitting: Physician Assistant

## 2018-11-17 ENCOUNTER — Other Ambulatory Visit: Payer: Self-pay

## 2018-11-17 VITALS — BP 154/80 | HR 83 | Ht 72.0 in | Wt 245.0 lb

## 2018-11-17 DIAGNOSIS — I10 Essential (primary) hypertension: Secondary | ICD-10-CM

## 2018-11-17 DIAGNOSIS — E291 Testicular hypofunction: Secondary | ICD-10-CM

## 2018-11-17 MED ORDER — TESTOSTERONE CYPIONATE 200 MG/ML IM SOLN
200.0000 mg | Freq: Once | INTRAMUSCULAR | Status: AC
  Administered 2018-11-17: 200 mg via INTRAMUSCULAR

## 2018-11-17 NOTE — Progress Notes (Signed)
200mg  testosterone given RUOQ without complications.

## 2018-11-19 ENCOUNTER — Encounter: Payer: Self-pay | Admitting: Physician Assistant

## 2018-12-02 ENCOUNTER — Other Ambulatory Visit: Payer: Self-pay | Admitting: Physician Assistant

## 2018-12-02 DIAGNOSIS — I10 Essential (primary) hypertension: Secondary | ICD-10-CM

## 2018-12-04 ENCOUNTER — Ambulatory Visit: Payer: Commercial Managed Care - PPO

## 2018-12-04 ENCOUNTER — Ambulatory Visit (INDEPENDENT_AMBULATORY_CARE_PROVIDER_SITE_OTHER): Payer: Commercial Managed Care - PPO | Admitting: Physician Assistant

## 2018-12-04 VITALS — BP 144/83 | HR 88 | Wt 243.0 lb

## 2018-12-04 DIAGNOSIS — I10 Essential (primary) hypertension: Secondary | ICD-10-CM

## 2018-12-04 DIAGNOSIS — E291 Testicular hypofunction: Secondary | ICD-10-CM | POA: Diagnosis not present

## 2018-12-04 MED ORDER — TESTOSTERONE CYPIONATE 200 MG/ML IM SOLN
200.0000 mg | Freq: Once | INTRAMUSCULAR | Status: AC
  Administered 2018-12-04: 200 mg via INTRAMUSCULAR

## 2018-12-04 NOTE — Progress Notes (Signed)
   Subjective:    Patient ID: Leon Henson, male    DOB: 09-Jun-1970, 48 y.o.   MRN: [id number]  HPI Patient is here for a testosterone injection. Denies chest pain, shortness of breath, headaches, problems with mood change, or medication problems.    Review of Systems     Objective:   Physical Exam Vitals:   12/04/18 1550 12/04/18 1558  BP: (!) 160/96 (!) 144/83  Pulse: 93 88  SpO2: 99%           Assessment & Plan:  Patient tolerated injection in LUOQ well without complications. Patient advised to schedule his next injection for 2 weeks from today.  Blood pressure was elevated on initial check at 160/96. Patient reports he missed 2 days of his blood pressure medication this weekend. He restarted medication today. Patient sat in room for 10 minutes and rested and repeat blood pressure 144/83

## 2018-12-05 ENCOUNTER — Encounter: Payer: Self-pay | Admitting: Physician Assistant

## 2018-12-05 DIAGNOSIS — I10 Essential (primary) hypertension: Secondary | ICD-10-CM | POA: Insufficient documentation

## 2018-12-15 ENCOUNTER — Ambulatory Visit: Payer: Commercial Managed Care - PPO | Admitting: Physician Assistant

## 2018-12-15 ENCOUNTER — Other Ambulatory Visit: Payer: Self-pay

## 2018-12-15 VITALS — BP 140/80 | HR 96 | Ht 72.0 in | Wt 246.0 lb

## 2018-12-15 DIAGNOSIS — E291 Testicular hypofunction: Secondary | ICD-10-CM

## 2018-12-15 MED ORDER — TESTOSTERONE CYPIONATE 200 MG/ML IM SOLN
200.0000 mg | Freq: Once | INTRAMUSCULAR | Status: AC
  Administered 2018-12-15: 200 mg via INTRAMUSCULAR

## 2018-12-15 NOTE — Progress Notes (Signed)
Patient is here for testosterone injection. Denies chest pain, shortness of breath, headaches, and problems with medication or mood changes.   Patient tolerated injection well without complications. Patient advised to schedule next injection in 14 days.

## 2018-12-19 ENCOUNTER — Encounter: Payer: Self-pay | Admitting: Physician Assistant

## 2018-12-19 ENCOUNTER — Other Ambulatory Visit: Payer: Self-pay | Admitting: Physician Assistant

## 2018-12-29 ENCOUNTER — Ambulatory Visit (INDEPENDENT_AMBULATORY_CARE_PROVIDER_SITE_OTHER): Payer: Commercial Managed Care - PPO | Admitting: Physician Assistant

## 2018-12-29 ENCOUNTER — Other Ambulatory Visit: Payer: Self-pay

## 2018-12-29 VITALS — BP 155/86 | HR 92 | Temp 98.7°F

## 2018-12-29 DIAGNOSIS — E291 Testicular hypofunction: Secondary | ICD-10-CM

## 2018-12-29 DIAGNOSIS — M1A9XX1 Chronic gout, unspecified, with tophus (tophi): Secondary | ICD-10-CM | POA: Diagnosis not present

## 2018-12-29 MED ORDER — TESTOSTERONE CYPIONATE 200 MG/ML IM SOLN
200.0000 mg | Freq: Once | INTRAMUSCULAR | Status: AC
  Administered 2018-12-29: 200 mg via INTRAMUSCULAR

## 2018-12-29 NOTE — Progress Notes (Signed)
Patient is here for testosterone injection. Denies chest pain, shortness of breath, headaches, and problems with medication or mood changes.   Patient's initial blood pressure reading was 155/86. After allowing the patient to sit for approximately 10 minutes his blood pressure reading was 144/86. Consulted with Nelson Chimes, PA-C about patient's blood pressure. She states okay to give testosterone injection. He has a history of white coat syndrome. He has not been checking blood pressures at home, but will start monitoring again.   Patient tolerated injection well without complications. Patient advised to schedule next injection in 14 days.   He also states gout medication was supposed to be increased to 80 mg (Uloric) in one month. He was to have labs redrawn to determine whether this was to be increased. Patient made aware to go to the lab for uric acid level.

## 2019-01-12 ENCOUNTER — Ambulatory Visit: Payer: Commercial Managed Care - PPO

## 2019-01-15 ENCOUNTER — Other Ambulatory Visit: Payer: Self-pay | Admitting: Physician Assistant

## 2019-01-19 ENCOUNTER — Ambulatory Visit: Payer: Commercial Managed Care - PPO

## 2019-01-26 ENCOUNTER — Other Ambulatory Visit: Payer: Self-pay

## 2019-01-26 ENCOUNTER — Ambulatory Visit (INDEPENDENT_AMBULATORY_CARE_PROVIDER_SITE_OTHER): Payer: Commercial Managed Care - PPO | Admitting: Sports Medicine

## 2019-01-26 VITALS — BP 117/75 | HR 96 | Wt 243.0 lb

## 2019-01-26 DIAGNOSIS — E291 Testicular hypofunction: Secondary | ICD-10-CM

## 2019-01-26 MED ORDER — TESTOSTERONE CYPIONATE 200 MG/ML IM SOLN
200.0000 mg | Freq: Once | INTRAMUSCULAR | Status: AC
  Administered 2019-01-26: 16:00:00 200 mg via INTRAMUSCULAR

## 2019-01-26 NOTE — Progress Notes (Signed)
   Subjective:    Patient ID: Leon Henson, male    DOB: 17-Jan-1971, 48 y.o.   MRN: [id number]  HPI Patient is here for a testosterone injection. Denies chest pain, shortness of breath, headaches, problems with mood change, or medication problems.   Review of Systems     Objective:   Physical Exam        Assessment & Plan:  Patient tolerated injection in RUOQ well without complications. Patient advised to schedule his next injection for 2 weeks from today.  Health Maintenance: Patient declined flu shot and Tdap. This has been documented.

## 2019-02-02 ENCOUNTER — Encounter: Payer: Self-pay | Admitting: Physician Assistant

## 2019-02-02 ENCOUNTER — Other Ambulatory Visit: Payer: Self-pay

## 2019-02-02 DIAGNOSIS — E790 Hyperuricemia without signs of inflammatory arthritis and tophaceous disease: Secondary | ICD-10-CM

## 2019-02-02 NOTE — Progress Notes (Signed)
Agree with documentation as above.   Catherine Metheney, MD  

## 2019-02-02 NOTE — Telephone Encounter (Signed)
Labs released and patient advised.

## 2019-02-03 LAB — URIC ACID: Uric Acid, Serum: 8.9 mg/dL — ABNORMAL HIGH (ref 4.0–8.0)

## 2019-02-08 ENCOUNTER — Encounter: Payer: Self-pay | Admitting: Physician Assistant

## 2019-02-08 DIAGNOSIS — M1A9XX1 Chronic gout, unspecified, with tophus (tophi): Secondary | ICD-10-CM

## 2019-02-09 ENCOUNTER — Ambulatory Visit: Payer: Commercial Managed Care - PPO

## 2019-02-09 MED ORDER — FEBUXOSTAT 80 MG PO TABS: 1.0000 | ORAL_TABLET | Freq: Every day | ORAL | 11 refills | Status: DC

## 2019-02-09 NOTE — Assessment & Plan Note (Signed)
Gout, tophi, hyperuricemia, increasing Uloric to 80 mg, recheck levels in 1 month.  Goal is less than 6.

## 2019-03-01 ENCOUNTER — Ambulatory Visit (INDEPENDENT_AMBULATORY_CARE_PROVIDER_SITE_OTHER): Payer: Commercial Managed Care - PPO | Admitting: Osteopathic Medicine

## 2019-03-01 ENCOUNTER — Other Ambulatory Visit: Payer: Self-pay

## 2019-03-01 VITALS — BP 137/79 | HR 73 | Ht 72.0 in | Wt 242.0 lb

## 2019-03-01 DIAGNOSIS — E291 Testicular hypofunction: Secondary | ICD-10-CM

## 2019-03-01 MED ORDER — TESTOSTERONE CYPIONATE 200 MG/ML IM SOLN
200.0000 mg | Freq: Once | INTRAMUSCULAR | Status: AC
  Administered 2019-03-01: 200 mg via INTRAMUSCULAR

## 2019-03-01 NOTE — Progress Notes (Signed)
Patient presents to clinic for a testosterone injection. He tolerated the injection in his LUOQ with no immediate complications. He has not had a recent testosterone lab draw. I put in the order for the labs and instructed the patient to come back in 1 week in the morning for a lab draw and he did not have any questions. Patient instructed to make a follow up in 2 weeks.

## 2019-03-01 NOTE — Patient Instructions (Signed)
Patient will follow up in 2 weeks for next injection.

## 2019-03-16 ENCOUNTER — Ambulatory Visit (INDEPENDENT_AMBULATORY_CARE_PROVIDER_SITE_OTHER): Payer: Commercial Managed Care - PPO | Admitting: Physician Assistant

## 2019-03-16 ENCOUNTER — Other Ambulatory Visit: Payer: Self-pay

## 2019-03-16 VITALS — BP 139/79 | HR 84 | Temp 98.5°F | Wt 246.0 lb

## 2019-03-16 DIAGNOSIS — E291 Testicular hypofunction: Secondary | ICD-10-CM

## 2019-03-16 MED ORDER — TESTOSTERONE CYPIONATE 200 MG/ML IM SOLN
100.0000 mg | Freq: Once | INTRAMUSCULAR | Status: AC
  Administered 2019-03-16: 100 mg via INTRAMUSCULAR

## 2019-03-16 NOTE — Progress Notes (Signed)
Patient presents today for testosterone cypionate 200 mg/1 ml injection. Patient is scheduled to get this injection every 2 weeks. Patients last injection was given on 03/01/2019 in the [facility].   Patient denies CP, palpitations, ShOB, abd pain, headache, mood swings.   Injection given in RUOQ. Pt tolerated injection well without complications. Pt instructed to stop at the front desk to schedule a nurse visit for his next injection that will be due in 2 weeks.

## 2019-03-17 LAB — TESTOSTERONE: Testosterone: 336 ng/dL (ref 250–827)

## 2019-03-30 ENCOUNTER — Other Ambulatory Visit: Payer: Self-pay

## 2019-03-30 ENCOUNTER — Ambulatory Visit (INDEPENDENT_AMBULATORY_CARE_PROVIDER_SITE_OTHER): Payer: Commercial Managed Care - PPO | Admitting: Osteopathic Medicine

## 2019-03-30 ENCOUNTER — Other Ambulatory Visit: Payer: Self-pay | Admitting: Physician Assistant

## 2019-03-30 VITALS — BP 143/79 | HR 80 | Ht 72.0 in | Wt 239.0 lb

## 2019-03-30 DIAGNOSIS — E291 Testicular hypofunction: Secondary | ICD-10-CM | POA: Diagnosis not present

## 2019-03-30 DIAGNOSIS — I10 Essential (primary) hypertension: Secondary | ICD-10-CM

## 2019-03-30 MED ORDER — TESTOSTERONE CYPIONATE 200 MG/ML IM SOLN
100.0000 mg | Freq: Once | INTRAMUSCULAR | Status: AC
  Administered 2019-03-30: 100 mg via INTRAMUSCULAR

## 2019-03-30 NOTE — Progress Notes (Signed)
Patient presents to clinic for a testosterone injection. He tolerated the injection in his RUOQ with no immediate complications. He has completed a recent testosterone lab draw and covering PCP sent a message to his mychart. Patient instructed to make a follow up in 2 weeks. No other questions at this time. He denies any chest pain, shortness of breath or mood swings while taking the medication. Patient declined repeat Blood Pressure.

## 2019-04-20 ENCOUNTER — Other Ambulatory Visit: Payer: Self-pay

## 2019-04-20 ENCOUNTER — Ambulatory Visit (INDEPENDENT_AMBULATORY_CARE_PROVIDER_SITE_OTHER): Payer: Commercial Managed Care - PPO | Admitting: Physician Assistant

## 2019-04-20 VITALS — BP 145/84 | HR 87 | Ht 72.0 in | Wt 239.0 lb

## 2019-04-20 DIAGNOSIS — E291 Testicular hypofunction: Secondary | ICD-10-CM

## 2019-04-20 MED ORDER — TESTOSTERONE CYPIONATE 200 MG/ML IM SOLN
100.0000 mg | Freq: Once | INTRAMUSCULAR | Status: AC
  Administered 2019-04-20: 11:00:00 100 mg via INTRAMUSCULAR

## 2019-04-20 NOTE — Progress Notes (Signed)
Patient presents to clinic for a testosterone injection. He tolerated the injection in his LUOQ with no immediate complications.Patient instructed to make a follow up in 2 weeks. No other questions at this time. He denies any chest pain, shortness of breath or mood swings while taking the medication.

## 2019-04-20 NOTE — Progress Notes (Signed)
Patient ID: Leon Henson, male   DOB: 1970-12-28, 49 y.o.   MRN: [id number] Agree with above plan.

## 2019-05-04 ENCOUNTER — Other Ambulatory Visit: Payer: Self-pay

## 2019-05-04 ENCOUNTER — Ambulatory Visit (INDEPENDENT_AMBULATORY_CARE_PROVIDER_SITE_OTHER): Payer: Commercial Managed Care - PPO | Admitting: Physician Assistant

## 2019-05-04 VITALS — BP 153/73 | HR 88 | Temp 98.4°F | Wt 249.1 lb

## 2019-05-04 DIAGNOSIS — E291 Testicular hypofunction: Secondary | ICD-10-CM

## 2019-05-04 MED ORDER — TESTOSTERONE CYPIONATE 200 MG/ML IM SOLN
200.0000 mg | Freq: Once | INTRAMUSCULAR | Status: AC
  Administered 2019-05-04: 200 mg via INTRAMUSCULAR

## 2019-05-04 NOTE — Progress Notes (Signed)
Patient ID: Leon Henson, male   DOB: 05/10/1970, 48 y.o.   MRN: [id number] Agree with above plan.  

## 2019-05-04 NOTE — Progress Notes (Signed)
Pt is here for testosterone injection. Denies chest pain, h/a's, palpitations, mood changes or problems with the medication. Pt tolerated injection well on the RUOQ without any complications. Pt advised to schedule next injection in 2 weeks. Pt's blood pressure was elevated during nurse visit. As per pt, he took his daily medications shortly before the visit and was nervous about getting the injection. Pt advised provider will be notified. Aware clinic will call with any updates or changes.

## 2019-05-18 ENCOUNTER — Ambulatory Visit: Payer: Commercial Managed Care - PPO

## 2019-05-25 ENCOUNTER — Other Ambulatory Visit: Payer: Self-pay

## 2019-05-25 ENCOUNTER — Other Ambulatory Visit: Payer: Self-pay | Admitting: Physician Assistant

## 2019-05-25 ENCOUNTER — Ambulatory Visit (INDEPENDENT_AMBULATORY_CARE_PROVIDER_SITE_OTHER): Payer: Commercial Managed Care - PPO | Admitting: Physician Assistant

## 2019-05-25 VITALS — BP 139/80 | HR 89

## 2019-05-25 DIAGNOSIS — E782 Mixed hyperlipidemia: Secondary | ICD-10-CM

## 2019-05-25 DIAGNOSIS — E291 Testicular hypofunction: Secondary | ICD-10-CM | POA: Diagnosis not present

## 2019-05-25 MED ORDER — TESTOSTERONE CYPIONATE 200 MG/ML IM SOLN
200.0000 mg | Freq: Once | INTRAMUSCULAR | Status: AC
  Administered 2019-05-25: 200 mg via INTRAMUSCULAR

## 2019-05-25 NOTE — Progress Notes (Signed)
   Subjective:    Patient ID: Leon Henson, male    DOB: 09/18/1970, 49 y.o.   MRN: [id number]  HPI Patient is here for a testosterone injection. Denies chest pain, shortness of breath, headaches, problems with mood change, or medication problems.    Review of Systems     Objective:   Physical Exam        Assessment & Plan:  Patient tolerated injection in LUOQ well without complications. Patient advised to schedule his next injection for 2 weeks from today. He was advised he must schedule appt to establish with new provider since previous PCP was [facility]. Patient understandable and states he will schedule with Dr Ashley Royalty at check out today.   Agree with plan. Tandy Gaw PA-C

## 2019-06-08 ENCOUNTER — Other Ambulatory Visit: Payer: Self-pay

## 2019-06-08 ENCOUNTER — Ambulatory Visit (INDEPENDENT_AMBULATORY_CARE_PROVIDER_SITE_OTHER): Payer: Commercial Managed Care - PPO | Admitting: Family Medicine

## 2019-06-08 ENCOUNTER — Encounter: Payer: Self-pay | Admitting: Family Medicine

## 2019-06-08 VITALS — BP 138/76 | HR 79 | Ht 72.0 in | Wt 248.0 lb

## 2019-06-08 DIAGNOSIS — E781 Pure hyperglyceridemia: Secondary | ICD-10-CM | POA: Diagnosis not present

## 2019-06-08 DIAGNOSIS — F411 Generalized anxiety disorder: Secondary | ICD-10-CM

## 2019-06-08 DIAGNOSIS — Z8739 Personal history of other diseases of the musculoskeletal system and connective tissue: Secondary | ICD-10-CM

## 2019-06-08 DIAGNOSIS — E790 Hyperuricemia without signs of inflammatory arthritis and tophaceous disease: Secondary | ICD-10-CM

## 2019-06-08 DIAGNOSIS — E291 Testicular hypofunction: Secondary | ICD-10-CM | POA: Diagnosis not present

## 2019-06-08 DIAGNOSIS — Z5181 Encounter for therapeutic drug level monitoring: Secondary | ICD-10-CM

## 2019-06-08 DIAGNOSIS — I10 Essential (primary) hypertension: Secondary | ICD-10-CM

## 2019-06-08 DIAGNOSIS — E782 Mixed hyperlipidemia: Secondary | ICD-10-CM

## 2019-06-08 MED ORDER — TESTOSTERONE CYPIONATE 200 MG/ML IM SOLN
100.0000 mg | Freq: Once | INTRAMUSCULAR | Status: AC
  Administered 2019-06-08: 10:00:00 100 mg via INTRAMUSCULAR

## 2019-06-08 MED ORDER — CITALOPRAM HYDROBROMIDE 10 MG PO TABS: 10.0000 mg | ORAL_TABLET | Freq: Every day | ORAL | 1 refills | Status: DC

## 2019-06-08 NOTE — Assessment & Plan Note (Signed)
Blood pressure is at goal at for age and co-morbidities.  I recommend he continue combination of amlodipine/valsartan at current dose.  In addition they were instructed to follow a low sodium diet with regular exercise to help to maintain adequate control of blood pressure.

## 2019-06-08 NOTE — Patient Instructions (Addendum)
Great to meet you today! Have labs completed. We'll be in touch with results.  Continue current medications Please follow up with me in 6 months.    Low-Purine Eating Plan A low-purine eating plan involves making food choices to limit your intake of purine. Purine is a kind of uric acid. Too much uric acid in your blood can cause certain conditions, such as gout and kidney stones. Eating a low-purine diet can help control these conditions. What are tips for following this plan? Reading food labels   Avoid foods with saturated or Trans fat.  Check the ingredient list of grains-based foods, such as bread and cereal, to make sure that they contain whole grains.  Check the ingredient list of sauces or soups to make sure they do not contain meat or fish.  When choosing soft drinks, check the ingredient list to make sure they do not contain high-fructose corn syrup. Shopping  Buy plenty of fresh fruits and vegetables.  Avoid buying canned or fresh fish.  Buy dairy products labeled as low-fat or nonfat.  Avoid buying premade or processed foods. These foods are often high in fat, salt (sodium), and added sugar. Cooking  Use olive oil instead of butter when cooking. Oils like olive oil, canola oil, and sunflower oil contain healthy fats. Meal planning  Learn which foods do or do not affect you. If you find out that a food tends to cause your gout symptoms to flare up, avoid eating that food. You can enjoy foods that do not cause problems. If you have any questions about a food item, talk with your dietitian or health care provider.  Limit foods high in fat, especially saturated fat. Fat makes it harder for your body to get rid of uric acid.  Choose foods that are lower in fat and are lean sources of protein. General guidelines  Limit alcohol intake to no more than 1 drink a day for nonpregnant women and 2 drinks a day for men. One drink equals 12 oz of beer, 5 oz of wine, or 1 oz of  hard liquor. Alcohol can affect the way your body gets rid of uric acid.  Drink plenty of water to keep your urine clear or pale yellow. Fluids can help remove uric acid from your body.  If directed by your health care provider, take a vitamin C supplement.  Work with your health care provider and dietitian to develop a plan to achieve or maintain a healthy weight. Losing weight can help reduce uric acid in your blood. What foods are recommended? The items listed may not be a complete list. Talk with your dietitian about what dietary choices are best for you. Foods low in purines Foods low in purines do not need to be limited. These include:  All fruits.  All low-purine vegetables, pickles, and olives.  Breads, pasta, rice, cornbread, and popcorn. Cake and other baked goods.  All dairy foods.  Eggs, nuts, and nut butters.  Spices and condiments, such as salt, herbs, and vinegar.  Plant oils, butter, and margarine.  Water, sugar-free soft drinks, tea, coffee, and cocoa.  Vegetable-based soups, broths, sauces, and gravies. Foods moderate in purines Foods moderate in purines should be limited to the amounts listed.   cup of asparagus, cauliflower, spinach, mushrooms, or green peas, each day.  2/3 cup uncooked oatmeal, each day.   cup dry wheat bran or wheat germ, each day.  2-3 ounces of meat or poultry, each day.  4-6 ounces of  shellfish, such as crab, lobster, oysters, or shrimp, each day.  1 cup cooked beans, peas, or lentils, each day.  Soup, broths, or bouillon made from meat or fish. Limit these foods as much as possible. What foods are not recommended? The items listed may not be a complete list. Talk with your dietitian about what dietary choices are best for you. Limit your intake of foods high in purines, including:  Beer and other alcohol.  Meat-based gravy or sauce.  Canned or fresh fish, such as: ? Anchovies, sardines, herring, and tuna. ? Mussels  and scallops. ? Codfish, trout, and haddock.  Berniece Salines.  Organ meats, such as: ? Liver or kidney. ? Tripe. ? Sweetbreads (thymus gland or pancreas).  Wild Clinical biochemist.  Yeast or yeast extract supplements.  Drinks sweetened with high-fructose corn syrup. Summary  Eating a low-purine diet can help control conditions caused by too much uric acid in the body, such as gout or kidney stones.  Choose low-purine foods, limit alcohol, and limit foods high in fat.  You will learn over time which foods do or do not affect you. If you find out that a food tends to cause your gout symptoms to flare up, avoid eating that food. This information is not intended to replace advice given to you by your health care provider. Make sure you discuss any questions you have with your health care provider. Document Revised: 03/11/2017 Document Reviewed: 05/12/2016 Elsevier Patient Education  2020 [employer].

## 2019-06-08 NOTE — Assessment & Plan Note (Signed)
No recent gout flares since Uloric increased to 80mg .  Continue at current dose and update uric acid levels.

## 2019-06-08 NOTE — Progress Notes (Signed)
Leon Henson - 49 y.o. male MRN [id number]  Date of birth: 05-29-1970  Subjective Chief Complaint  Patient presents with  . Hypogonadism    HPI Leon Henson is a 49 y.o. male with history of gout, HTN, Hypogonadism and HLD here today for follow up. He reports he is doing well today.   -HTN:  Treated with amlodipine/valsartan.  He is tolerating well.  He does not monitor at home.  He denies high salt diet. He has not had any symptoms related to HTN including chest pain, shortness of breath, headache or vision changes.   -HLD:  Treated with atorvastatin.  He is tolerating well. Due for updated lipid panel.  Denies myalgias or RUQ pain/GI upset related to medication.     -Hypogonadism:  Receives 200mg  q2 weeks. He is doing well with this.  Testosterone in 03/2019 WNL.    -Elevated uric acid:  He is currently treated with uloric.  This was increased to 80mg  a few months ago.  He denies any recent gout flares.    ROS:  A comprehensive ROS was completed and negative except as noted per HPI  Allergies  Allergen Reactions  . Hydrochlorothiazide Other (See Comments)    ED, muscle cramps, gout  . Lisinopril Cough    Past Medical History:  Diagnosis Date  . Anxiety   . Gout   . Hypertension   . Hyperuricemia 10/23/2018  . Prediabetes 03/07/2018    Past Surgical History:  Procedure Laterality Date  . KNEE ARTHROCENTESIS Left     Social History   Socioeconomic History  . Marital status: Single    Spouse name: Not on file  . Number of children: Not on file  . Years of education: Not on file  . Highest education level: Not on file  Occupational History  . Not on file  Tobacco Use  . Smoking status: Never Smoker  . Smokeless tobacco: Never Used  Substance and Sexual Activity  . Alcohol use: Yes    Alcohol/week: 25.0 standard drinks    Types: 25 Cans of beer per week  . Drug use: No  . Sexual activity: Yes  Other Topics Concern  . Not on file  Social History Narrative  .  Not on file   Social Determinants of Health   Financial Resource Strain:   . Difficulty of Paying Living Expenses: Not on file  Food Insecurity:   . Worried About Charity fundraiser in the Last Year: Not on file  . Ran Out of Food in the Last Year: Not on file  Transportation Needs:   . Lack of Transportation (Medical): Not on file  . Lack of Transportation (Non-Medical): Not on file  Physical Activity:   . Days of Exercise per Week: Not on file  . Minutes of Exercise per Session: Not on file  Stress:   . Feeling of Stress : Not on file  Social Connections:   . Frequency of Communication with Friends and Family: Not on file  . Frequency of Social Gatherings with Friends and Family: Not on file  . Attends Religious Services: Not on file  . Active Member of Clubs or Organizations: Not on file  . Attends Archivist Meetings: Not on file  . Marital Status: Not on file    Family History  Problem Relation Age of Onset  . Heart failure Mother   . Heart attack Mother   . Hypertension Father     Health Maintenance  Topic Date Due  .  COLONOSCOPY  05/05/2019  . TETANUS/TDAP  01/11/2020 (Originally 05/04/1988)  . HIV Screening  02/09/2023 (Originally 05/04/1984)  . INFLUENZA VACCINE  Discontinued     ----------------------------------------------------------------------------------------------------------------------------------------------------------------------------------------------------------------- Physical Exam BP 138/76   Pulse 79   Ht 6' (1.829 m)   Wt 248 lb (112.5 kg)   BMI 33.63 kg/m   Physical Exam Constitutional:      Appearance: Normal appearance.  HENT:     Head: Normocephalic and atraumatic.     Mouth/Throat:     Mouth: Mucous membranes are moist.  Eyes:     General: No scleral icterus. Cardiovascular:     Rate and Rhythm: Normal rate and regular rhythm.  Pulmonary:     Effort: Pulmonary effort is normal.     Breath sounds: Normal breath  sounds.  Musculoskeletal:     Cervical back: Neck supple.  Skin:    General: Skin is warm and dry.  Neurological:     General: No focal deficit present.     Mental Status: He is alert.  Psychiatric:        Mood and Affect: Mood normal.        Behavior: Behavior normal.     ------------------------------------------------------------------------------------------------------------------------------------------------------------------------------------------------------------------- Assessment and Plan  Hypertension goal BP (blood pressure) < 130/80 Blood pressure is at goal at for age and co-morbidities.  I recommend he continue combination of amlodipine/valsartan at current dose.  In addition they were instructed to follow a low sodium diet with regular exercise to help to maintain adequate control of blood pressure.    Hypogonadism in male Testosterone injection given today.  Last levels in 03/2019 were in normal range.   GAD (generalized anxiety disorder) Stable with citalopram, continue.  Medication renewed.   Hyperuricemia No recent gout flares since Uloric increased to 80mg .  Continue at current dose and update uric acid levels.   Mixed hyperlipidemia Tolerating atorvastatin well.  Continue at current dose and update lipid panel today.    Meds ordered this encounter  Medications  . testosterone cypionate (DEPOTESTOSTERONE CYPIONATE) injection 100 mg  . citalopram (CELEXA) 10 MG tablet    Sig: Take 1 tablet (10 mg total) by mouth daily.    Dispense:  90 tablet    Refill:  1    Return in about 6 months (around 12/06/2019) for HTN/Gout/Low T.    This visit occurred during the SARS-CoV-2 public health emergency.  Safety protocols were in place, including screening questions prior to the visit, additional usage of staff PPE, and extensive cleaning of exam room while observing appropriate contact time as indicated for disinfecting solutions.

## 2019-06-08 NOTE — Assessment & Plan Note (Signed)
Stable with citalopram, continue.  Medication renewed.

## 2019-06-08 NOTE — Assessment & Plan Note (Signed)
Tolerating atorvastatin well.  Continue at current dose and update lipid panel today.

## 2019-06-08 NOTE — Assessment & Plan Note (Signed)
Testosterone injection given today.  Last levels in 03/2019 were in normal range.

## 2019-06-17 LAB — COMPLETE METABOLIC PANEL WITH GFR
AG Ratio: 1.5 (calc) (ref 1.0–2.5)
ALT: 35 U/L (ref 9–46)
AST: 32 U/L (ref 10–35)
Albumin: 4.1 g/dL (ref 3.6–5.1)
Alkaline phosphatase (APISO): 72 U/L (ref 35–144)
BUN: 12 mg/dL (ref 7–25)
CO2: 27 mmol/L (ref 20–32)
Calcium: 9.3 mg/dL (ref 8.6–10.3)
Chloride: 105 mmol/L (ref 98–110)
Creat: 0.91 mg/dL (ref 0.70–1.33)
GFR, Est African American: 113 mL/min/{1.73_m2} (ref >=60)
GFR, Est Non African American: 98 mL/min/{1.73_m2} (ref >=60)
Globulin: 2.8 g/dL (calc) (ref 1.9–3.7)
Glucose, Bld: 120 mg/dL (ref 65–139)
Potassium: 4 mmol/L (ref 3.5–5.3)
Sodium: 142 mmol/L (ref 135–146)
Total Bilirubin: 0.5 mg/dL (ref 0.2–1.2)
Total Protein: 6.9 g/dL (ref 6.1–8.1)

## 2019-06-17 LAB — CBC
HCT: 47.9 % (ref 38.5–50.0)
Hemoglobin: 16.6 g/dL (ref 13.2–17.1)
MCH: 31.1 pg (ref 27.0–33.0)
MCHC: 34.7 g/dL (ref 32.0–36.0)
MCV: 89.7 fL (ref 80.0–100.0)
MPV: 10.3 fL (ref 7.5–12.5)
Platelets: 264 10*3/uL (ref 140–400)
RBC: 5.34 10*6/uL (ref 4.20–5.80)
RDW: 13 % (ref 11.0–15.0)
WBC: 5.5 10*3/uL (ref 3.8–10.8)

## 2019-06-17 LAB — URIC ACID: Uric Acid, Serum: 5.7 mg/dL (ref 4.0–8.0)

## 2019-06-17 LAB — LIPID PANEL W/REFLEX DIRECT LDL
Cholesterol: 162 mg/dL (ref <200)
HDL: 35 mg/dL — ABNORMAL LOW (ref >=40)
Non-HDL Cholesterol (Calc): 127 mg/dL (calc) (ref <130)
Total CHOL/HDL Ratio: 4.6 (calc) (ref <5.0)
Triglycerides: 406 mg/dL — ABNORMAL HIGH (ref <150)

## 2019-06-17 LAB — PSA: PSA: 0.4 ng/mL (ref <=4.0)

## 2019-06-17 LAB — DIRECT LDL: Direct LDL: 68 mg/dL (ref <100)

## 2019-06-22 ENCOUNTER — Ambulatory Visit: Payer: Commercial Managed Care - PPO

## 2019-06-29 ENCOUNTER — Ambulatory Visit (INDEPENDENT_AMBULATORY_CARE_PROVIDER_SITE_OTHER): Payer: Commercial Managed Care - PPO | Admitting: Family Medicine

## 2019-06-29 ENCOUNTER — Other Ambulatory Visit: Payer: Self-pay

## 2019-06-29 VITALS — BP 134/81 | HR 92 | Temp 98.3°F | Wt 252.0 lb

## 2019-06-29 DIAGNOSIS — E291 Testicular hypofunction: Secondary | ICD-10-CM

## 2019-06-29 MED ORDER — TESTOSTERONE CYPIONATE 200 MG/ML IM SOLN
200.0000 mg | Freq: Once | INTRAMUSCULAR | Status: AC
  Administered 2019-06-29: 200 mg via INTRAMUSCULAR

## 2019-06-29 NOTE — Progress Notes (Signed)
Pt had a NV for testosterone injection. Denies chest pain, h/a's, SOB, palpitations, lightheadedness, sleep, mood or medication problems. As per provider, pt given 200 mg as directed. Pt tolerated injection well on LUOQ without any complications. Pt instructed to make next appointment in two weeks.

## 2019-07-08 ENCOUNTER — Other Ambulatory Visit: Payer: Self-pay | Admitting: Osteopathic Medicine

## 2019-07-08 DIAGNOSIS — I10 Essential (primary) hypertension: Secondary | ICD-10-CM

## 2019-07-12 ENCOUNTER — Other Ambulatory Visit: Payer: Self-pay

## 2019-07-12 ENCOUNTER — Ambulatory Visit (INDEPENDENT_AMBULATORY_CARE_PROVIDER_SITE_OTHER): Payer: Commercial Managed Care - PPO | Admitting: Family Medicine

## 2019-07-12 VITALS — BP 147/81 | HR 85

## 2019-07-12 DIAGNOSIS — E291 Testicular hypofunction: Secondary | ICD-10-CM | POA: Diagnosis not present

## 2019-07-12 MED ORDER — TESTOSTERONE CYPIONATE 200 MG/ML IM SOLN
200.0000 mg | Freq: Once | INTRAMUSCULAR | Status: AC
  Administered 2019-07-12: 10:00:00 200 mg via INTRAMUSCULAR

## 2019-07-12 NOTE — Progress Notes (Signed)
Medical screening examination/treatment was performed by qualified clinical staff member and as supervising physician I was immediately available for consultation/collaboration. I have reviewed documentation and agree with assessment and plan.  Heatherly Stenner, DO  

## 2019-07-12 NOTE — Progress Notes (Signed)
Patient is here for testosterone injection. Denies chest pain, shortness of breath, headaches, and problems with medication or mood changes.   Patient tolerated injection 200 mg of testosterone well to RUOQ without complications. Patient advised to follow up in 14 days for repeat injection.

## 2019-07-27 ENCOUNTER — Other Ambulatory Visit: Payer: Self-pay

## 2019-07-27 ENCOUNTER — Ambulatory Visit (INDEPENDENT_AMBULATORY_CARE_PROVIDER_SITE_OTHER): Payer: Commercial Managed Care - PPO | Admitting: Family Medicine

## 2019-07-27 VITALS — BP 147/78 | HR 91 | Wt 249.0 lb

## 2019-07-27 DIAGNOSIS — E291 Testicular hypofunction: Secondary | ICD-10-CM | POA: Diagnosis not present

## 2019-07-27 MED ORDER — TESTOSTERONE CYPIONATE 200 MG/ML IM SOLN
200.0000 mg | Freq: Once | INTRAMUSCULAR | Status: AC
  Administered 2019-07-27: 16:00:00 200 mg via INTRAMUSCULAR

## 2019-07-27 NOTE — Progress Notes (Signed)
Established Patient Office Visit  Subjective:  Patient ID: Leon Henson, male    DOB: [birth year]  Age: 49 y.o. MRN: [id number]  CC:  Chief Complaint  Patient presents with  . Hypogonadism    HPI  Leon Henson is here for a testosterone injection. Denies chest pain, shortness of breath, headaches or mood changes.   Past Medical History:  Diagnosis Date  . Anxiety   . Gout   . Hypertension   . Hyperuricemia 10/23/2018  . Prediabetes 03/07/2018    Past Surgical History:  Procedure Laterality Date  . KNEE ARTHROCENTESIS Left     Family History  Problem Relation Age of Onset  . Heart failure Mother   . Heart attack Mother   . Hypertension Father     Social History   Socioeconomic History  . Marital status: Single    Spouse name: Not on file  . Number of children: Not on file  . Years of education: Not on file  . Highest education level: Not on file  Occupational History  . Not on file  Tobacco Use  . Smoking status: Never Smoker  . Smokeless tobacco: Never Used  Substance and Sexual Activity  . Alcohol use: Yes    Alcohol/week: 25.0 standard drinks    Types: 25 Cans of beer per week  . Drug use: No  . Sexual activity: Yes  Other Topics Concern  . Not on file  Social History Narrative  . Not on file   Social Determinants of Health   Financial Resource Strain:   . Difficulty of Paying Living Expenses:   Food Insecurity:   . Worried About Charity fundraiser in the Last Year:   . Arboriculturist in the Last Year:   Transportation Needs:   . Film/video editor (Medical):   Marland Kitchen Lack of Transportation (Non-Medical):   Physical Activity:   . Days of Exercise per Week:   . Minutes of Exercise per Session:   Stress:   . Feeling of Stress :   Social Connections:   . Frequency of Communication with Friends and Family:   . Frequency of Social Gatherings with Friends and Family:   . Attends Religious Services:   . Active Member of Clubs or Organizations:    . Attends Archivist Meetings:   Marland Kitchen Marital Status:   Intimate Partner Violence:   . Fear of Current or Ex-Partner:   . Emotionally Abused:   Marland Kitchen Physically Abused:   . Sexually Abused:     Outpatient Medications Prior to Visit  Medication Sig Dispense Refill  . ALPRAZolam (XANAX) 0.5 MG tablet Take 1 tablet (0.5 mg total) by mouth once as needed for up to 1 dose for anxiety. 20 tablet 0  . amLODipine-valsartan (EXFORGE) 10-160 MG tablet TAKE 1 TABLET BY MOUTH EVERY DAY 90 tablet 2  . atorvastatin (LIPITOR) 20 MG tablet TAKE 1 TABLET BY MOUTH EVERY DAY 90 tablet 0  . citalopram (CELEXA) 10 MG tablet Take 1 tablet (10 mg total) by mouth daily. 90 tablet 1  . colchicine 0.6 MG tablet Take 1-2x daily for gout prevention and treatment. 60 tablet 2  . Febuxostat (ULORIC) 80 MG TABS Take 1 tablet (80 mg total) by mouth daily. 30 tablet 11  . ibuprofen (ADVIL) 200 MG tablet Take 400-800 mg by mouth every 6 (six) hours as needed.    . testosterone cypionate (DEPOTESTOSTERONE CYPIONATE) 200 MG/ML injection Inject 1 mL (200 mg total) into the  muscle every 14 (fourteen) days. 5 mL 0   No facility-administered medications prior to visit.    Allergies  Allergen Reactions  . Hydrochlorothiazide Other (See Comments)    ED, muscle cramps, gout  . Lisinopril Cough    ROS Review of Systems    Objective:    Physical Exam  BP (!) 158/85   Pulse 91   Wt 249 lb (112.9 kg)   SpO2 98%   BMI 33.77 kg/m  Wt Readings from Last 3 Encounters:  07/27/19 249 lb (112.9 kg)  06/29/19 252 lb (114.3 kg)  06/08/19 248 lb (112.5 kg)     Health Maintenance Due  Topic Date Due  . COLONOSCOPY  Never done    There are no preventive care reminders to display for this patient.  Lab Results  Component Value Date   TSH 1.65 03/03/2018   Lab Results  Component Value Date   WBC 5.5 06/15/2019   HGB 16.6 06/15/2019   HCT 47.9 06/15/2019   MCV 89.7 06/15/2019   PLT 264 06/15/2019   Lab  Results  Component Value Date   NA 142 06/15/2019   K 4.0 06/15/2019   CO2 27 06/15/2019   GLUCOSE 120 06/15/2019   BUN 12 06/15/2019   CREATININE 0.91 06/15/2019   BILITOT 0.5 06/15/2019   AST 32 06/15/2019   ALT 35 06/15/2019   PROT 6.9 06/15/2019   CALCIUM 9.3 06/15/2019   Lab Results  Component Value Date   CHOL 162 06/15/2019   Lab Results  Component Value Date   HDL 35 (L) 06/15/2019   Lab Results  Component Value Date   [facility]  06/15/2019     Comment:     . LDL cholesterol not calculated. Triglyceride levels greater than 400 mg/dL invalidate calculated LDL results. . Reference range: <100 . Desirable range <100 mg/dL for primary prevention;   <70 mg/dL for patients with CHD or diabetic patients  with > or = 2 CHD risk factors. Marland Kitchen LDL-C is now calculated using the Martin-Hopkins  calculation, which is a validated novel method providing  better accuracy than the Friedewald equation in the  estimation of LDL-C.  Horald Pollen et al. Lenox Ahr. 7628;315(17): 2061-2068  (http://education.[facility].com/faq/FAQ164)    Lab Results  Component Value Date   TRIG 406 (H) 06/15/2019   Lab Results  Component Value Date   CHOLHDL 4.6 06/15/2019   Lab Results  Component Value Date   HGBA1C 6.0 (H) 10/20/2018      Assessment & Plan:  Hypogonadism - Patient tolerated injection well without complications. Patient advised to schedule next injection 14 days from today.    Problem List Items Addressed This Visit    Hypogonadism in male - Primary      Meds ordered this encounter  Medications  . testosterone cypionate (DEPOTESTOSTERONE CYPIONATE) injection 200 mg    Follow-up: Return in about 2 weeks (around 08/10/2019) for testosterone injection. Leon Henson, Leon Henson, CMA

## 2019-07-27 NOTE — Progress Notes (Signed)
Medical screening examination/treatment was performed by qualified clinical staff member and as supervising physician I was immediately available for consultation/collaboration. I have reviewed documentation and agree with assessment and plan.  Ritta Hammes, DO  

## 2019-08-09 ENCOUNTER — Other Ambulatory Visit: Payer: Self-pay | Admitting: Physician Assistant

## 2019-08-09 DIAGNOSIS — E782 Mixed hyperlipidemia: Secondary | ICD-10-CM

## 2019-08-10 ENCOUNTER — Ambulatory Visit: Payer: Commercial Managed Care - PPO

## 2019-08-10 NOTE — Progress Notes (Deleted)
Patient is here for testosterone injection. Denies chest pain, shortness of breath, headaches, and problems with medication or mood changes.   Patient tolerated testosterone 200 mg injection to RUOQ well without complications. Patient advised to schedule next injection in 14 days.   

## 2019-09-06 ENCOUNTER — Other Ambulatory Visit: Payer: Self-pay

## 2019-09-06 ENCOUNTER — Ambulatory Visit (INDEPENDENT_AMBULATORY_CARE_PROVIDER_SITE_OTHER): Payer: Commercial Managed Care - PPO | Admitting: Family Medicine

## 2019-09-06 VITALS — BP 127/72 | HR 76

## 2019-09-06 DIAGNOSIS — E291 Testicular hypofunction: Secondary | ICD-10-CM

## 2019-09-06 MED ORDER — TESTOSTERONE CYPIONATE 200 MG/ML IM SOLN
200.0000 mg | Freq: Once | INTRAMUSCULAR | Status: AC
Start: 2019-09-06 — End: 2019-09-06
  Administered 2019-09-06: 200 mg via INTRAMUSCULAR

## 2019-09-06 NOTE — Progress Notes (Signed)
Established Patient Office Visit  Subjective:  Patient ID: Leon Henson, male    DOB: [birth year]  Age: 49 y.o. MRN: [id number]  CC:  Chief Complaint  Patient presents with  . Hypogonadism    HPI Leon Henson is here for a testosterone injection. Denies chest pain, shortness of breath, headaches or mood changes.   Past Medical History:  Diagnosis Date  . Anxiety   . Gout   . Hypertension   . Hyperuricemia 10/23/2018  . Prediabetes 03/07/2018    Past Surgical History:  Procedure Laterality Date  . KNEE ARTHROCENTESIS Left     Family History  Problem Relation Age of Onset  . Heart failure Mother   . Heart attack Mother   . Hypertension Father     Social History   Socioeconomic History  . Marital status: Single    Spouse name: Not on file  . Number of children: Not on file  . Years of education: Not on file  . Highest education level: Not on file  Occupational History  . Not on file  Tobacco Use  . Smoking status: Never Smoker  . Smokeless tobacco: Never Used  Substance and Sexual Activity  . Alcohol use: Yes    Alcohol/week: 25.0 standard drinks    Types: 25 Cans of beer per week  . Drug use: No  . Sexual activity: Yes  Other Topics Concern  . Not on file  Social History Narrative  . Not on file   Social Determinants of Health   Financial Resource Strain:   . Difficulty of Paying Living Expenses:   Food Insecurity:   . Worried About Charity fundraiser in the Last Year:   . Arboriculturist in the Last Year:   Transportation Needs:   . Film/video editor (Medical):   Marland Kitchen Lack of Transportation (Non-Medical):   Physical Activity:   . Days of Exercise per Week:   . Minutes of Exercise per Session:   Stress:   . Feeling of Stress :   Social Connections:   . Frequency of Communication with Friends and Family:   . Frequency of Social Gatherings with Friends and Family:   . Attends Religious Services:   . Active Member of Clubs or Organizations:    . Attends Archivist Meetings:   Marland Kitchen Marital Status:   Intimate Partner Violence:   . Fear of Current or Ex-Partner:   . Emotionally Abused:   Marland Kitchen Physically Abused:   . Sexually Abused:     Outpatient Medications Prior to Visit  Medication Sig Dispense Refill  . ALPRAZolam (XANAX) 0.5 MG tablet Take 1 tablet (0.5 mg total) by mouth once as needed for up to 1 dose for anxiety. 20 tablet 0  . amLODipine-valsartan (EXFORGE) 10-160 MG tablet TAKE 1 TABLET BY MOUTH EVERY DAY 90 tablet 2  . atorvastatin (LIPITOR) 20 MG tablet TAKE 1 TABLET BY MOUTH EVERY DAY 90 tablet 3  . citalopram (CELEXA) 10 MG tablet Take 1 tablet (10 mg total) by mouth daily. 90 tablet 1  . colchicine 0.6 MG tablet Take 1-2x daily for gout prevention and treatment. 60 tablet 2  . Febuxostat (ULORIC) 80 MG TABS Take 1 tablet (80 mg total) by mouth daily. 30 tablet 11  . ibuprofen (ADVIL) 200 MG tablet Take 400-800 mg by mouth every 6 (six) hours as needed.    . testosterone cypionate (DEPOTESTOSTERONE CYPIONATE) 200 MG/ML injection Inject 1 mL (200 mg total) into the muscle  every 14 (fourteen) days. 5 mL 0   No facility-administered medications prior to visit.    Allergies  Allergen Reactions  . Hydrochlorothiazide Other (See Comments)    ED, muscle cramps, gout  . Lisinopril Cough    ROS Review of Systems    Objective:    Physical Exam  BP (!) 144/81   Pulse 76   SpO2 99%  Wt Readings from Last 3 Encounters:  07/27/19 249 lb (112.9 kg)  06/29/19 252 lb (114.3 kg)  06/08/19 248 lb (112.5 kg)     Health Maintenance Due  Topic Date Due  . COVID-19 Vaccine (1) Never done  . COLONOSCOPY  Never done    There are no preventive care reminders to display for this patient.  Lab Results  Component Value Date   TSH 1.65 03/03/2018   Lab Results  Component Value Date   WBC 5.5 06/15/2019   HGB 16.6 06/15/2019   HCT 47.9 06/15/2019   MCV 89.7 06/15/2019   PLT 264 06/15/2019   Lab Results   Component Value Date   NA 142 06/15/2019   K 4.0 06/15/2019   CO2 27 06/15/2019   GLUCOSE 120 06/15/2019   BUN 12 06/15/2019   CREATININE 0.91 06/15/2019   BILITOT 0.5 06/15/2019   AST 32 06/15/2019   ALT 35 06/15/2019   PROT 6.9 06/15/2019   CALCIUM 9.3 06/15/2019   Lab Results  Component Value Date   CHOL 162 06/15/2019   Lab Results  Component Value Date   HDL 35 (L) 06/15/2019   Lab Results  Component Value Date   [facility]  06/15/2019     Comment:     . LDL cholesterol not calculated. Triglyceride levels greater than 400 mg/dL invalidate calculated LDL results. . Reference range: <100 . Desirable range <100 mg/dL for primary prevention;   <70 mg/dL for patients with CHD or diabetic patients  with > or = 2 CHD risk factors. Marland Kitchen LDL-C is now calculated using the Martin-Hopkins  calculation, which is a validated novel method providing  better accuracy than the Friedewald equation in the  estimation of LDL-C.  Horald Pollen et al. Lenox Ahr. 4132;440(10): 2061-2068  (http://education.[facility].com/faq/FAQ164)    Lab Results  Component Value Date   TRIG 406 (H) 06/15/2019   Lab Results  Component Value Date   CHOLHDL 4.6 06/15/2019   Lab Results  Component Value Date   HGBA1C 6.0 (H) 10/20/2018      Assessment & Plan:  Hypogonadism - Patient tolerated injection well without complications. Patient advised to schedule next injection 14 days from today.    Problem List Items Addressed This Visit    Hypogonadism in male - Primary      Meds ordered this encounter  Medications  . testosterone cypionate (DEPOTESTOSTERONE CYPIONATE) injection 200 mg    Follow-up: Return in about 2 weeks (around 09/20/2019) for B12 injection. Earna Coder, Janalyn Harder, CMA

## 2019-09-06 NOTE — Progress Notes (Signed)
Medical screening examination/treatment was performed by qualified clinical staff member and as supervising physician I was immediately available for consultation/collaboration. I have reviewed documentation and agree with assessment and plan.  Vahan Wadsworth, DO  

## 2019-09-21 ENCOUNTER — Ambulatory Visit (INDEPENDENT_AMBULATORY_CARE_PROVIDER_SITE_OTHER): Payer: Commercial Managed Care - PPO | Admitting: Family Medicine

## 2019-09-21 ENCOUNTER — Encounter: Payer: Self-pay | Admitting: Family Medicine

## 2019-09-21 VITALS — BP 140/87 | HR 70 | Wt 246.0 lb

## 2019-09-21 DIAGNOSIS — E291 Testicular hypofunction: Secondary | ICD-10-CM | POA: Diagnosis not present

## 2019-09-21 MED ORDER — TESTOSTERONE CYPIONATE 200 MG/ML IM SOLN
200.0000 mg | INTRAMUSCULAR | Status: DC
  Administered 2019-09-21 – 2019-10-19 (×3): 200 mg via INTRAMUSCULAR

## 2019-09-21 NOTE — Progress Notes (Signed)
Medical screening examination/treatment was performed by qualified clinical staff member and as supervising physician I was immediately available for consultation/collaboration. I have reviewed documentation and agree with assessment and plan.  Cole Eastridge, DO  

## 2019-09-21 NOTE — Progress Notes (Signed)
Patient here for a testosterone injection. Denies chest pain, SOB headaches and problems with medications or mood swings.

## 2019-10-05 ENCOUNTER — Ambulatory Visit (INDEPENDENT_AMBULATORY_CARE_PROVIDER_SITE_OTHER): Payer: Commercial Managed Care - PPO | Admitting: Family Medicine

## 2019-10-05 DIAGNOSIS — E291 Testicular hypofunction: Secondary | ICD-10-CM | POA: Diagnosis not present

## 2019-10-05 NOTE — Progress Notes (Signed)
Pt here for testosterone injection no SOB,CP or mood swings. Injection tolerated well given in RUOQ.

## 2019-10-05 NOTE — Progress Notes (Signed)
Medical screening examination/treatment was performed by qualified clinical staff member and as supervising physician I was immediately available for consultation/collaboration. I have reviewed documentation and agree with assessment and plan.  Enio Hornback, DO  

## 2019-10-19 ENCOUNTER — Ambulatory Visit (INDEPENDENT_AMBULATORY_CARE_PROVIDER_SITE_OTHER): Payer: Commercial Managed Care - PPO | Admitting: Family Medicine

## 2019-10-19 DIAGNOSIS — E291 Testicular hypofunction: Secondary | ICD-10-CM

## 2019-10-19 NOTE — Progress Notes (Signed)
Medical screening examination/treatment was performed by qualified clinical staff member and as supervising physician I was immediately available for consultation/collaboration. I have reviewed documentation and agree with assessment and plan.  Regan Mcbryar, DO  

## 2019-10-19 NOTE — Progress Notes (Signed)
Pt here for testosterone injection no SOB,CP or mood swings. Injection tolerated well given in LUOQ   Pt to RTC in 2 wks for next injection

## 2019-11-01 ENCOUNTER — Ambulatory Visit (INDEPENDENT_AMBULATORY_CARE_PROVIDER_SITE_OTHER): Payer: Commercial Managed Care - PPO | Admitting: Family Medicine

## 2019-11-01 VITALS — BP 139/78 | HR 71

## 2019-11-01 DIAGNOSIS — E291 Testicular hypofunction: Secondary | ICD-10-CM

## 2019-11-01 MED ORDER — TESTOSTERONE CYPIONATE 200 MG/ML IM SOLN
200.0000 mg | Freq: Once | INTRAMUSCULAR | Status: AC
  Administered 2019-11-01: 200 mg via INTRAMUSCULAR

## 2019-11-01 NOTE — Progress Notes (Signed)
Patient is here for testosterone injection. Denies chest pain, shortness of breath, headaches, and problems with medication or mood changes.   Patient tolerated testosterone 200 mg injection to RUOQ well without complications. Patient advised to schedule next injection in 14 days.   

## 2019-11-01 NOTE — Progress Notes (Signed)
Medical screening examination/treatment was performed by qualified clinical staff member and as supervising physician I was immediately available for consultation/collaboration. I have reviewed documentation and agree with assessment and plan.  Bora Bost, DO  

## 2019-11-16 ENCOUNTER — Other Ambulatory Visit: Payer: Self-pay

## 2019-11-16 ENCOUNTER — Ambulatory Visit (INDEPENDENT_AMBULATORY_CARE_PROVIDER_SITE_OTHER): Payer: Commercial Managed Care - PPO | Admitting: Family Medicine

## 2019-11-16 VITALS — BP 145/78 | HR 74 | Ht 72.0 in | Wt 248.0 lb

## 2019-11-16 DIAGNOSIS — E291 Testicular hypofunction: Secondary | ICD-10-CM | POA: Diagnosis not present

## 2019-11-16 MED ORDER — TESTOSTERONE CYPIONATE 200 MG/ML IM SOLN
200.0000 mg | Freq: Once | INTRAMUSCULAR | Status: DC
  Administered 2019-11-16: 200 mg via INTRAMUSCULAR

## 2019-11-16 NOTE — Progress Notes (Signed)
Medical screening examination/treatment was performed by qualified clinical staff member and as supervising physician I was immediately available for consultation/collaboration. I have reviewed documentation and agree with assessment and plan.  Obrien Huskins, DO  

## 2019-11-16 NOTE — Progress Notes (Signed)
Pt here for testosterone injection no SOB,CP or mood swings. Injection tolerated well given in LUOQ. Pt to RTC in 2 weeks for next injection  

## 2019-11-30 ENCOUNTER — Ambulatory Visit: Payer: Commercial Managed Care - PPO

## 2019-12-07 ENCOUNTER — Encounter: Payer: Self-pay | Admitting: Family Medicine

## 2019-12-07 ENCOUNTER — Ambulatory Visit (INDEPENDENT_AMBULATORY_CARE_PROVIDER_SITE_OTHER): Payer: Commercial Managed Care - PPO | Admitting: Family Medicine

## 2019-12-07 ENCOUNTER — Other Ambulatory Visit: Payer: Self-pay

## 2019-12-07 VITALS — BP 132/81 | HR 75 | Temp 97.9°F | Wt 244.4 lb

## 2019-12-07 DIAGNOSIS — R7303 Prediabetes: Secondary | ICD-10-CM

## 2019-12-07 DIAGNOSIS — I10 Essential (primary) hypertension: Secondary | ICD-10-CM

## 2019-12-07 DIAGNOSIS — E291 Testicular hypofunction: Secondary | ICD-10-CM

## 2019-12-07 DIAGNOSIS — E782 Mixed hyperlipidemia: Secondary | ICD-10-CM | POA: Diagnosis not present

## 2019-12-07 DIAGNOSIS — M1A9XX1 Chronic gout, unspecified, with tophus (tophi): Secondary | ICD-10-CM

## 2019-12-07 DIAGNOSIS — E790 Hyperuricemia without signs of inflammatory arthritis and tophaceous disease: Secondary | ICD-10-CM

## 2019-12-07 MED ORDER — FEBUXOSTAT 80 MG PO TABS: 1.0000 | ORAL_TABLET | Freq: Every day | ORAL | 11 refills | Status: DC

## 2019-12-07 NOTE — Assessment & Plan Note (Signed)
Lab Results  Component Value Date   LABURIC 5.7 06/15/2019    Continue uloric at current strength

## 2019-12-07 NOTE — Assessment & Plan Note (Signed)
Blood pressure is at goal at for age and co-morbidities.  I recommend continuation exforge at current strength.  In addition they were instructed to follow a low sodium diet with regular exercise to help to maintain adequate control of blood pressure.

## 2019-12-07 NOTE — Progress Notes (Signed)
Patient is here for a testosterone injection of 200mg  Given in ROUQ.  Denies chest pain, shortness of breath, headaches and problems with medication or mood changes.  Tolerated injection well without complications.   Patient advised to schedule next injection in 14 days.

## 2019-12-07 NOTE — Assessment & Plan Note (Signed)
He will receive testosterone injection today.   Update testosterone levels in 1 week.

## 2019-12-07 NOTE — Assessment & Plan Note (Signed)
Tolerating atorvastatin well.  Update lipid profile

## 2019-12-07 NOTE — Patient Instructions (Signed)
Great to see you! Have labs completed next week.  Follow up with me in about 6 months.

## 2019-12-07 NOTE — Progress Notes (Signed)
Leon Henson - 49 y.o. male MRN [id number]  Date of birth: 10/21/1970  Subjective Chief Complaint  Patient presents with  . Hypertension  . Hypogonadism    HPI Leon Henson is a 49 y.o. male with history of HTN, HLD, gout, and hypogonadism here today for follow up.  He states he is doing well.  He missed dose of testosterone last week and would like to have this today.   His BP is managed with amlodipine/valsartan combo.  He is doing well and denies side effects.  BP at home has been well controlled.  He denies symptoms related to HTN including chest pain, shortness of breath, headache or vision change.    He continues on uloric for management of elevated uric acid levels.  Denies recent gout flares.   He is tolerating atorvastatin well.  Denies increased myalgias.    ROS:  A comprehensive ROS was completed and negative except as noted per HPI  Allergies  Allergen Reactions  . Hydrochlorothiazide Other (See Comments)    ED, muscle cramps, gout  . Lisinopril Cough    Past Medical History:  Diagnosis Date  . Anxiety   . Gout   . Hypertension   . Hyperuricemia 10/23/2018  . Prediabetes 03/07/2018    Past Surgical History:  Procedure Laterality Date  . KNEE ARTHROCENTESIS Left     Social History   Socioeconomic History  . Marital status: Single    Spouse name: Not on file  . Number of children: Not on file  . Years of education: Not on file  . Highest education level: Not on file  Occupational History  . Not on file  Tobacco Use  . Smoking status: Never Smoker  . Smokeless tobacco: Never Used  Substance and Sexual Activity  . Alcohol use: Yes    Alcohol/week: 25.0 standard drinks    Types: 25 Cans of beer per week  . Drug use: No  . Sexual activity: Yes  Other Topics Concern  . Not on file  Social History Narrative  . Not on file   Social Determinants of Health   Financial Resource Strain:   . Difficulty of Paying Living Expenses: Not on file  Food  Insecurity:   . Worried About Programme researcher, broadcasting/film/video in the Last Year: Not on file  . Ran Out of Food in the Last Year: Not on file  Transportation Needs:   . Lack of Transportation (Medical): Not on file  . Lack of Transportation (Non-Medical): Not on file  Physical Activity:   . Days of Exercise per Week: Not on file  . Minutes of Exercise per Session: Not on file  Stress:   . Feeling of Stress : Not on file  Social Connections:   . Frequency of Communication with Friends and Family: Not on file  . Frequency of Social Gatherings with Friends and Family: Not on file  . Attends Religious Services: Not on file  . Active Member of Clubs or Organizations: Not on file  . Attends Banker Meetings: Not on file  . Marital Status: Not on file    Family History  Problem Relation Age of Onset  . Heart failure Mother   . Heart attack Mother   . Hypertension Father     Health Maintenance  Topic Date Due  . TETANUS/TDAP  01/11/2020 (Originally 05/04/1988)  . COLONOSCOPY  11/15/2020 (Originally 05/05/2019)  . COVID-19 Vaccine (1) 11/15/2020 (Originally 05/04/1981)  . Hepatitis C Screening  11/15/2020 (Originally 07/19/1970)  .  HIV Screening  02/09/2023 (Originally 05/04/1984)  . INFLUENZA VACCINE  Discontinued     ----------------------------------------------------------------------------------------------------------------------------------------------------------------------------------------------------------------- Physical Exam BP 132/81 (BP Location: Left Arm, Patient Position: Sitting, Cuff Size: Large)   Pulse 75   Temp 97.9 F (36.6 C) (Temporal)   Wt 244 lb 6.4 oz (110.9 kg)   SpO2 100%   BMI 33.15 kg/m   Physical Exam Constitutional:      Appearance: Normal appearance.  HENT:     Head: Normocephalic and atraumatic.  Eyes:     General: No scleral icterus. Cardiovascular:     Rate and Rhythm: Normal rate and regular rhythm.  Pulmonary:     Effort: Pulmonary  effort is normal.     Breath sounds: Normal breath sounds.  Musculoskeletal:     Cervical back: Neck supple.  Skin:    General: Skin is warm and dry.  Neurological:     General: No focal deficit present.     Mental Status: He is alert.     ------------------------------------------------------------------------------------------------------------------------------------------------------------------------------------------------------------------- Assessment and Plan  Hypertension goal BP (blood pressure) < 130/80 Blood pressure is at goal at for age and co-morbidities.  I recommend continuation exforge at current strength.  In addition they were instructed to follow a low sodium diet with regular exercise to help to maintain adequate control of blood pressure.    Hypogonadism in male He will receive testosterone injection today.   Update testosterone levels in 1 week.    Mixed hyperlipidemia Tolerating atorvastatin well.  Update lipid profile  Hyperuricemia Lab Results  Component Value Date   LABURIC 5.7 06/15/2019    Continue uloric at current strength   No orders of the defined types were placed in this encounter.   Return in about 6 months (around 06/08/2020) for HTN.    This visit occurred during the SARS-CoV-2 public health emergency.  Safety protocols were in place, including screening questions prior to the visit, additional usage of staff PPE, and extensive cleaning of exam room while observing appropriate contact time as indicated for disinfecting solutions.

## 2019-12-21 ENCOUNTER — Ambulatory Visit: Payer: Commercial Managed Care - PPO

## 2020-01-06 LAB — COMPLETE METABOLIC PANEL WITH GFR
AG Ratio: 1.6 (calc) (ref 1.0–2.5)
ALT: 42 U/L (ref 9–46)
AST: 47 U/L — ABNORMAL HIGH (ref 10–35)
Albumin: 4.4 g/dL (ref 3.6–5.1)
Alkaline phosphatase (APISO): 77 U/L (ref 35–144)
BUN: 13 mg/dL (ref 7–25)
CO2: 25 mmol/L (ref 20–32)
Calcium: 9.6 mg/dL (ref 8.6–10.3)
Chloride: 104 mmol/L (ref 98–110)
Creat: 0.89 mg/dL (ref 0.70–1.33)
GFR, Est African American: 116 mL/min/{1.73_m2} (ref >=60)
GFR, Est Non African American: 100 mL/min/{1.73_m2} (ref >=60)
Globulin: 2.8 g/dL (calc) (ref 1.9–3.7)
Glucose, Bld: 108 mg/dL — ABNORMAL HIGH (ref 65–99)
Potassium: 4.1 mmol/L (ref 3.5–5.3)
Sodium: 139 mmol/L (ref 135–146)
Total Bilirubin: 0.7 mg/dL (ref 0.2–1.2)
Total Protein: 7.2 g/dL (ref 6.1–8.1)

## 2020-01-06 LAB — CBC
HCT: 53.4 % — ABNORMAL HIGH (ref 38.5–50.0)
Hemoglobin: 17.3 g/dL — ABNORMAL HIGH (ref 13.2–17.1)
MCH: 30.2 pg (ref 27.0–33.0)
MCHC: 32.4 g/dL (ref 32.0–36.0)
MCV: 93.2 fL (ref 80.0–100.0)
MPV: 12.4 fL (ref 7.5–12.5)
Platelets: 262 10*3/uL (ref 140–400)
RBC: 5.73 10*6/uL (ref 4.20–5.80)
RDW: 12.5 % (ref 11.0–15.0)
WBC: 5.4 10*3/uL (ref 3.8–10.8)

## 2020-01-06 LAB — TESTOSTERONE: Testosterone: 78 ng/dL — ABNORMAL LOW (ref 250–827)

## 2020-01-06 LAB — LIPID PANEL
Cholesterol: 181 mg/dL (ref <200)
HDL: 45 mg/dL (ref >=40)
LDL Cholesterol (Calc): 101 mg/dL (calc) — ABNORMAL HIGH
Non-HDL Cholesterol (Calc): 136 mg/dL (calc) — ABNORMAL HIGH (ref <130)
Total CHOL/HDL Ratio: 4 (calc) (ref <5.0)
Triglycerides: 232 mg/dL — ABNORMAL HIGH (ref <150)

## 2020-01-06 LAB — HEMOGLOBIN A1C
Hgb A1c MFr Bld: 6 % of total Hgb — ABNORMAL HIGH (ref <5.7)
Mean Plasma Glucose: 126 (calc)
eAG (mmol/L): 7 (calc)

## 2020-01-11 ENCOUNTER — Ambulatory Visit: Payer: Commercial Managed Care - PPO

## 2020-03-24 ENCOUNTER — Encounter: Payer: Self-pay | Admitting: Family Medicine

## 2020-03-25 ENCOUNTER — Other Ambulatory Visit: Payer: Self-pay | Admitting: Family Medicine

## 2020-03-25 DIAGNOSIS — F4322 Adjustment disorder with anxiety: Secondary | ICD-10-CM

## 2020-03-25 MED ORDER — ALPRAZOLAM 0.5 MG PO TABS: 0.5000 mg | ORAL_TABLET | Freq: Once | ORAL | 0 refills | Status: DC | PRN

## 2020-06-02 ENCOUNTER — Encounter: Payer: Self-pay | Admitting: Family Medicine

## 2020-06-03 MED ORDER — CITALOPRAM HYDROBROMIDE 10 MG PO TABS: 10.0000 mg | ORAL_TABLET | Freq: Every day | ORAL | 0 refills | Status: DC

## 2020-06-10 ENCOUNTER — Other Ambulatory Visit: Payer: Self-pay | Admitting: Family Medicine

## 2020-06-10 DIAGNOSIS — I10 Essential (primary) hypertension: Secondary | ICD-10-CM

## 2020-06-13 ENCOUNTER — Ambulatory Visit: Payer: Commercial Managed Care - PPO | Admitting: Family Medicine

## 2020-06-20 ENCOUNTER — Ambulatory Visit: Payer: Commercial Managed Care - PPO | Admitting: Family Medicine

## 2020-06-20 ENCOUNTER — Other Ambulatory Visit: Payer: Self-pay

## 2020-06-20 ENCOUNTER — Encounter: Payer: Self-pay | Admitting: Family Medicine

## 2020-06-20 DIAGNOSIS — I10 Essential (primary) hypertension: Secondary | ICD-10-CM

## 2020-06-20 DIAGNOSIS — F411 Generalized anxiety disorder: Secondary | ICD-10-CM

## 2020-06-20 NOTE — Patient Instructions (Signed)
Check blood pressure at home and send readings over the next couple of weeks.  See me again in about 6 months.

## 2020-06-22 NOTE — Progress Notes (Signed)
Leon Henson - 50 y.o. male MRN [id number]  Date of birth: 1970/04/30  Subjective Chief Complaint  Patient presents with  . Hypertension    HPI Leon Henson is a 50 y.o. male here today for follow up of HTN.  HTN is currently managed with amlodipine/valsartan.  He reports that he is doing well with this.  He took medication just prior to walking into the clinic today.  He does not check blood pressure at home.  He denies chest pain, shortness of breath, palpitations, headache or vision changes.    Continues on citalopram for anxiety.  Also has alprazolam that he uses occasionally as needed for anxiety.    ROS:  A comprehensive ROS was completed and negative except as noted per HPI  Allergies  Allergen Reactions  . Hydrochlorothiazide Other (See Comments)    ED, muscle cramps, gout  . Lisinopril Cough    Past Medical History:  Diagnosis Date  . Anxiety   . Gout   . Hypertension   . Hyperuricemia 10/23/2018  . Prediabetes 03/07/2018    Past Surgical History:  Procedure Laterality Date  . KNEE ARTHROCENTESIS Left     Social History   Socioeconomic History  . Marital status: Single    Spouse name: Not on file  . Number of children: Not on file  . Years of education: Not on file  . Highest education level: Not on file  Occupational History  . Not on file  Tobacco Use  . Smoking status: Never Smoker  . Smokeless tobacco: Never Used  Substance and Sexual Activity  . Alcohol use: Yes    Alcohol/week: 25.0 standard drinks    Types: 25 Cans of beer per week  . Drug use: No  . Sexual activity: Yes  Other Topics Concern  . Not on file  Social History Narrative  . Not on file   Social Determinants of Health   Financial Resource Strain: Not on file  Food Insecurity: Not on file  Transportation Needs: Not on file  Physical Activity: Not on file  Stress: Not on file  Social Connections: Not on file    Family History  Problem Relation Age of Onset  . Heart failure  Mother   . Heart attack Mother   . Hypertension Father     Health Maintenance  Topic Date Due  . TETANUS/TDAP  Never done  . COLONOSCOPY (Pts 45-65yrs Insurance coverage will need to be confirmed)  11/15/2020 (Originally 05/04/2014)  . COVID-19 Vaccine (1) 11/15/2020 (Originally 05/04/1974)  . Hepatitis C Screening  11/15/2020 (Originally Apr 02, 1971)  . HIV Screening  02/09/2023 (Originally 05/04/1984)  . HPV VACCINES  Aged Out  . INFLUENZA VACCINE  Discontinued     ----------------------------------------------------------------------------------------------------------------------------------------------------------------------------------------------------------------- Physical Exam BP (!) 161/83 (BP Location: Left Arm, Patient Position: Sitting, Cuff Size: Large)   Pulse 90   Wt 245 lb 4.8 oz (111.3 kg)   SpO2 99%   BMI 33.27 kg/m   Physical Exam Constitutional:      Appearance: Normal appearance.  HENT:     Head: Normocephalic and atraumatic.  Cardiovascular:     Rate and Rhythm: Normal rate and regular rhythm.  Pulmonary:     Effort: Pulmonary effort is normal.     Breath sounds: Normal breath sounds.  Musculoskeletal:     Cervical back: Neck supple.  Neurological:     General: No focal deficit present.     Mental Status: He is alert.     ------------------------------------------------------------------------------------------------------------------------------------------------------------------------------------------------------------------- Assessment and Plan  Hypertension goal BP (blood pressure) < 130/80 He just took medication prior to appt.  Recommend that he check BP at home and send readings through mychart.  He will continue to current medication and recommend a low sodium diet.    GAD (generalized anxiety disorder) Continue citalopram with alprazolam as needed.    No orders of the defined types were placed in this encounter.   Return in about  6 months (around 12/21/2020) for HTN.    This visit occurred during the SARS-CoV-2 public health emergency.  Safety protocols were in place, including screening questions prior to the visit, additional usage of staff PPE, and extensive cleaning of exam room while observing appropriate contact time as indicated for disinfecting solutions.

## 2020-06-22 NOTE — Assessment & Plan Note (Signed)
Continue citalopram with alprazolam as needed.

## 2020-06-22 NOTE — Assessment & Plan Note (Signed)
He just took medication prior to appt.  Recommend that he check BP at home and send readings through mychart.  He will continue to current medication and recommend a low sodium diet.

## 2020-08-29 ENCOUNTER — Other Ambulatory Visit: Payer: Self-pay | Admitting: Family Medicine

## 2020-08-29 DIAGNOSIS — E782 Mixed hyperlipidemia: Secondary | ICD-10-CM

## 2020-09-16 ENCOUNTER — Other Ambulatory Visit: Payer: Self-pay | Admitting: Family Medicine

## 2020-09-17 NOTE — Telephone Encounter (Signed)
Please advise 

## 2020-09-20 ENCOUNTER — Other Ambulatory Visit: Payer: Self-pay | Admitting: Family Medicine

## 2020-09-22 MED ORDER — CITALOPRAM HYDROBROMIDE 10 MG PO TABS: 10.0000 mg | ORAL_TABLET | Freq: Every day | ORAL | 0 refills | Status: DC

## 2020-10-23 ENCOUNTER — Ambulatory Visit: Payer: Commercial Managed Care - PPO | Attending: Critical Care Medicine

## 2020-10-23 DIAGNOSIS — Z20822 Contact with and (suspected) exposure to covid-19: Secondary | ICD-10-CM

## 2020-10-24 LAB — SARS-COV-2, NAA 2 DAY TAT

## 2020-10-24 LAB — NOVEL CORONAVIRUS, NAA: SARS-CoV-2, NAA: DETECTED — AB

## 2020-10-29 ENCOUNTER — Encounter: Payer: Self-pay | Admitting: Family Medicine

## 2020-10-29 ENCOUNTER — Other Ambulatory Visit: Payer: Self-pay | Admitting: Family Medicine

## 2020-10-29 DIAGNOSIS — F4322 Adjustment disorder with anxiety: Secondary | ICD-10-CM

## 2020-10-29 MED ORDER — ALPRAZOLAM 0.5 MG PO TABS: 0.5000 mg | ORAL_TABLET | Freq: Once | ORAL | 0 refills | Status: DC | PRN

## 2020-10-30 ENCOUNTER — Encounter: Payer: Self-pay | Admitting: Family Medicine

## 2020-11-25 ENCOUNTER — Other Ambulatory Visit: Payer: Self-pay

## 2020-11-25 ENCOUNTER — Encounter: Payer: Self-pay | Admitting: Medical-Surgical

## 2020-11-25 ENCOUNTER — Ambulatory Visit: Payer: Commercial Managed Care - PPO | Admitting: Medical-Surgical

## 2020-11-25 VITALS — BP 144/74 | HR 79 | Resp 20 | Wt 249.0 lb

## 2020-11-25 DIAGNOSIS — R350 Frequency of micturition: Secondary | ICD-10-CM

## 2020-11-25 DIAGNOSIS — R7303 Prediabetes: Secondary | ICD-10-CM | POA: Diagnosis not present

## 2020-11-25 DIAGNOSIS — R1032 Left lower quadrant pain: Secondary | ICD-10-CM

## 2020-11-25 DIAGNOSIS — E782 Mixed hyperlipidemia: Secondary | ICD-10-CM

## 2020-11-25 DIAGNOSIS — I10 Essential (primary) hypertension: Secondary | ICD-10-CM

## 2020-11-25 DIAGNOSIS — E291 Testicular hypofunction: Secondary | ICD-10-CM

## 2020-11-25 LAB — POCT URINALYSIS DIP (CLINITEK)
Bilirubin, UA: NEGATIVE
Blood, UA: NEGATIVE
Glucose, UA: NEGATIVE mg/dL
Ketones, POC UA: NEGATIVE mg/dL
Leukocytes, UA: NEGATIVE
Nitrite, UA: NEGATIVE
POC PROTEIN,UA: NEGATIVE
Spec Grav, UA: 1.015 (ref 1.010–1.025)
Urobilinogen, UA: 0.2 E.U./dL
pH, UA: 6.5 (ref 5.0–8.0)

## 2020-11-25 NOTE — Progress Notes (Signed)
  HPI with pertinent ROS:   CC: Left lower quadrant abdominal pain  HPI: Pleasant 50 year old male presenting today with left lower quadrant abdominal pain over the last 2 weeks.  Notes the pain does radiate to his left testicle as well as up to his hip joint.  Notes the pain is worse when bending and sitting positions.  Describes the different comfort as dull but sometimes sharp.  He has also noted some burning with urination and frequent urination. Having BMs once daily but notes that he does have to strain more often than not.  Has been feeling bloated, fatigued, nauseated, and overall unwell.  Has noticed a small bulge on the right side after eating but this is intermittent.  He complains of frequent urination and intermittent burning with urination.  Has also felt that his urinary stream is weaker.  He is currently sexually active but no concerns for STDs.  Denies fever, chills, vomiting, diarrhea, melena, hematochezia, penile discharge, and back pain.  I reviewed the past medical history, family history, social history, surgical history, and allergies today and no changes were needed.  Please see the problem list section below in epic for further details.   Physical exam:   General: Well Developed, well nourished, and in no acute distress.  Neuro: Alert and oriented x3.  HEENT: Normocephalic, atraumatic.  Skin: Warm and dry. Cardiac: Regular rate and rhythm, no murmurs rubs or gallops, no lower extremity edema.  Respiratory: Clear to auscultation bilaterally. Not using accessory muscles, speaking in full sentences. Abdomen: Soft, nontender, nondistended. Bowel sounds + x 4 quadrants. No HSM appreciated.  No inguinal bulging on the left side to indicate possible hernia.  Unable to reproduce tenderness in general although patient does report an increase in pressure with palpation.  Impression and Recommendations:    1. Frequent urination POCT urinalysis negative for any concerns. - POCT  URINALYSIS DIP (CLINITEK)  2. LLQ abdominal pain Left lower quadrant abdominal pain radiating into the suprapubic area as well as down to the testicles.  Stat CT abdomen pelvis for evaluation for diverticulitis.  Checking CBC with differential and CMP today.  3. Prediabetes Since he has an upcoming wellness visit with Dr. Ashley Royalty, we will go ahead and get his labs today.  Checking hemoglobin A1c.  4. Mixed hyperlipidemia Checking lipid panel for his upcoming wellness visit.  5. Hypogonadism in male Checking testosterone.  6. Hypertension goal BP (blood pressure) < 130/80 Blood pressure is mildly elevated today but he is considerably in pain.  Checking CBC and CMP.  Return if symptoms worsen or fail to improve. ___________________________________________ Thayer Ohm, DNP, APRN, FNP-BC Primary Care and Sports Medicine [facility] [place]

## 2020-11-26 ENCOUNTER — Ambulatory Visit (INDEPENDENT_AMBULATORY_CARE_PROVIDER_SITE_OTHER): Payer: Commercial Managed Care - PPO

## 2020-11-26 DIAGNOSIS — R1032 Left lower quadrant pain: Secondary | ICD-10-CM | POA: Diagnosis not present

## 2020-11-26 LAB — CBC WITH DIFFERENTIAL/PLATELET
Absolute Monocytes: 800 cells/uL (ref 200–950)
Basophils Absolute: 72 cells/uL (ref 0–200)
Basophils Relative: 0.9 %
Eosinophils Absolute: 304 cells/uL (ref 15–500)
Eosinophils Relative: 3.8 %
HCT: 42.6 % (ref 38.5–50.0)
Hemoglobin: 14.8 g/dL (ref 13.2–17.1)
Lymphs Abs: 2008 cells/uL (ref 850–3900)
MCH: 30.7 pg (ref 27.0–33.0)
MCHC: 34.7 g/dL (ref 32.0–36.0)
MCV: 88.4 fL (ref 80.0–100.0)
MPV: 10.5 fL (ref 7.5–12.5)
Monocytes Relative: 10 %
Neutro Abs: 4816 cells/uL (ref 1500–7800)
Neutrophils Relative %: 60.2 %
Platelets: 253 10*3/uL (ref 140–400)
RBC: 4.82 10*6/uL (ref 4.20–5.80)
RDW: 13 % (ref 11.0–15.0)
Total Lymphocyte: 25.1 %
WBC: 8 10*3/uL (ref 3.8–10.8)

## 2020-11-26 LAB — COMPLETE METABOLIC PANEL WITH GFR
AG Ratio: 1.8 (calc) (ref 1.0–2.5)
ALT: 37 U/L (ref 9–46)
AST: 36 U/L — ABNORMAL HIGH (ref 10–35)
Albumin: 4.7 g/dL (ref 3.6–5.1)
Alkaline phosphatase (APISO): 77 U/L (ref 35–144)
BUN: 18 mg/dL (ref 7–25)
CO2: 24 mmol/L (ref 20–32)
Calcium: 9.4 mg/dL (ref 8.6–10.3)
Chloride: 103 mmol/L (ref 98–110)
Creat: 1 mg/dL (ref 0.70–1.30)
Globulin: 2.6 g/dL (calc) (ref 1.9–3.7)
Glucose, Bld: 111 mg/dL — ABNORMAL HIGH (ref 65–99)
Potassium: 4 mmol/L (ref 3.5–5.3)
Sodium: 136 mmol/L (ref 135–146)
Total Bilirubin: 0.5 mg/dL (ref 0.2–1.2)
Total Protein: 7.3 g/dL (ref 6.1–8.1)
eGFR: 91 mL/min/{1.73_m2} (ref >=60)

## 2020-11-26 LAB — HEMOGLOBIN A1C
Hgb A1c MFr Bld: 5.9 % of total Hgb — ABNORMAL HIGH (ref <5.7)
Mean Plasma Glucose: 123 mg/dL
eAG (mmol/L): 6.8 mmol/L

## 2020-11-26 LAB — TESTOSTERONE: Testosterone: 175 ng/dL — ABNORMAL LOW (ref 250–827)

## 2020-11-26 LAB — LIPID PANEL
Cholesterol: 255 mg/dL — ABNORMAL HIGH (ref <200)
HDL: 37 mg/dL — ABNORMAL LOW (ref >=40)
Non-HDL Cholesterol (Calc): 218 mg/dL (calc) — ABNORMAL HIGH (ref <130)
Total CHOL/HDL Ratio: 6.9 (calc) — ABNORMAL HIGH (ref <5.0)
Triglycerides: 1197 mg/dL — ABNORMAL HIGH (ref <150)

## 2020-12-22 ENCOUNTER — Encounter: Payer: Self-pay | Admitting: Family Medicine

## 2020-12-22 DIAGNOSIS — M1A9XX1 Chronic gout, unspecified, with tophus (tophi): Secondary | ICD-10-CM

## 2020-12-22 MED ORDER — CITALOPRAM HYDROBROMIDE 10 MG PO TABS: 10.0000 mg | ORAL_TABLET | Freq: Every day | ORAL | 0 refills | Status: DC

## 2020-12-22 MED ORDER — FEBUXOSTAT 80 MG PO TABS: 1.0000 | ORAL_TABLET | Freq: Every day | ORAL | 0 refills | Status: DC

## 2020-12-26 ENCOUNTER — Ambulatory Visit: Payer: Commercial Managed Care - PPO | Admitting: Family Medicine

## 2020-12-26 ENCOUNTER — Other Ambulatory Visit: Payer: Self-pay

## 2020-12-26 ENCOUNTER — Encounter: Payer: Self-pay | Admitting: Family Medicine

## 2020-12-26 VITALS — BP 144/77 | HR 83 | Ht 72.0 in | Wt 247.0 lb

## 2020-12-26 DIAGNOSIS — E291 Testicular hypofunction: Secondary | ICD-10-CM | POA: Diagnosis not present

## 2020-12-26 DIAGNOSIS — Z8739 Personal history of other diseases of the musculoskeletal system and connective tissue: Secondary | ICD-10-CM

## 2020-12-26 DIAGNOSIS — I10 Essential (primary) hypertension: Secondary | ICD-10-CM

## 2020-12-26 MED ORDER — TESTOSTERONE CYPIONATE 200 MG/ML IM SOLN
200.0000 mg | INTRAMUSCULAR | Status: DC
  Administered 2020-12-26 – 2021-02-13 (×3): 200 mg via INTRAMUSCULAR

## 2020-12-26 NOTE — Patient Instructions (Addendum)
Great to see you! We'll do testosterone every 2 weeks and repeat levels in about 3 months.  Continue current medications.  Preventing High Cholesterol Cholesterol is a white, waxy substance similar to fat that the human body needs to help build cells. The liver makes all the cholesterol that a person's body needs. Having high cholesterol (hypercholesterolemia) increases your risk for heart disease and stroke. Extra or excess cholesterol comes from the food that you eat. High cholesterol can often be prevented with diet and lifestyle changes. If you already have high cholesterol, you can control it with diet, lifestyle changes, and medicines. How can high cholesterol affect me? If you have high cholesterol, fatty deposits (plaques) may build up on the walls of your blood vessels. The blood vessels that carry blood away from your heart are called arteries. Plaques make the arteries narrower and stiffer. This in turn can: Restrict or block blood flow and cause blood clots to form. Increase your risk for heart attack and stroke. What can increase my risk for high cholesterol? This condition is more likely to develop in people who: Eat foods that are high in saturated fat or cholesterol. Saturated fat is mostly found in foods that come from animal sources. Are overweight. Are not getting enough exercise. Use products that contain nicotine or tobacco, such as cigarettes, e-cigarettes, and chewing tobacco. Have a family history of high cholesterol (familial hypercholesterolemia). What actions can I take to prevent this? Nutrition  Eat less saturated fat. Avoid trans fats (partially hydrogenated oils). These are often found in margarine and in some baked goods, fried foods, and snacks bought in packages. Avoid precooked or cured meat, such as bacon, sausages, or meat loaves. Avoid foods and drinks that have added sugars. Eat more fruits, vegetables, and whole grains. Choose healthy sources of  protein, such as fish, poultry, lean cuts of red meat, beans, peas, lentils, and nuts. Choose healthy sources of fat, such as: Nuts. Vegetable oils, especially olive oil. Fish that have healthy fats, such as omega-3 fatty acids. These fish include mackerel or salmon. Lifestyle Lose weight if you are overweight. Maintaining a healthy body mass index (BMI) can help prevent or control high cholesterol. It can also lower your risk for diabetes and high blood pressure. Ask your health care provider to help you with a diet and exercise plan to lose weight safely. Do not use any products that contain nicotine or tobacco. These products include cigarettes, chewing tobacco, and vaping devices, such as e-cigarettes. If you need help quitting, ask your health care provider. Alcohol use Do not drink alcohol if: Your health care provider tells you not to drink. You are pregnant, may be pregnant, or are planning to become pregnant. If you drink alcohol: Limit how much you have to: 0-1 drink a day for women. 0-2 drinks a day for men. Know how much alcohol is in your drink. In the [place] one drink equals one 12 oz bottle of beer (355 mL), one 5 oz glass of wine (148 mL), or one 1 oz glass of hard liquor (44 mL). Activity  Get enough exercise. Do exercises as told by your health care provider. Each week, do at least 150 minutes of exercise that takes a medium level of effort (moderate-intensity exercise). This kind of exercise: Makes your heart beat faster while allowing you to still be able to talk. Can be done in short sessions several times a day or longer sessions a few times a week. For example, on 5  days each week, you could walk fast or ride your bike 3 times a day for 10 minutes each time. Medicines Your health care provider may recommend medicines to help lower cholesterol. This may be a medicine to lower the amount of cholesterol that your liver makes. You may need medicine if: Diet and lifestyle  changes have not lowered your cholesterol enough. You have high cholesterol and other risk factors for heart disease or stroke. Take over-the-counter and prescription medicines only as told by your health care provider. General information Manage your risk factors for high cholesterol. Talk with your health care provider about all your risk factors and how to lower your risk. Manage other conditions that you have, such as diabetes or high blood pressure (hypertension). Have blood tests to check your cholesterol levels at regular points in time as told by your health care provider. Keep all follow-up visits. This is important. Where to find more information American Heart Association: www.heart.org National Heart, Lung, and Blood Institute: [employer].is Summary High cholesterol increases your risk for heart disease and stroke. By keeping your cholesterol level low, you can reduce your risk for these conditions. High cholesterol can often be prevented with diet and lifestyle changes. Work with your health care provider to manage your risk factors, and have your blood tested regularly. This information is not intended to replace advice given to you by your health care provider. Make sure you discuss any questions you have with your health care provider. Document Revised: 06/02/2020 Document Reviewed: 06/02/2020 Elsevier Patient Education  2022 [employer].

## 2020-12-28 NOTE — Assessment & Plan Note (Signed)
No recent gout flares.  Doing well with Uloric.

## 2020-12-28 NOTE — Assessment & Plan Note (Signed)
Testosterone injection given today, 20 mg.  We will plan to repeat in 2 weeks.  Update levels in about 3 months.

## 2020-12-28 NOTE — Assessment & Plan Note (Signed)
Blood pressure fairly well controlled at this time.  He will continue current medications.  Low-sodium diet encouraged.

## 2020-12-28 NOTE — Progress Notes (Signed)
Leon Henson - 50 y.o. male MRN [id number]  Date of birth: 07/08/1970  Subjective Chief Complaint  Patient presents with   Hypertension    HPI Leon Henson is a 50 year old male here today for follow-up of hypertension and hypogonadism.  Continues on amlodipine/valsartan for management of hypertension.  He is tolerating this well.  Readings at home are normal per his report.  He denies chest pain, shortness of breath, palpitations, headache or vision changes.  Like to restart testosterone.  He felt better while taking this.  He would like to get updated injections today.  Continues on Uloric for management of elevated uric acid levels and gout.  No recent flares.  ROS:  A comprehensive ROS was completed and negative except as noted per HPI  Allergies  Allergen Reactions   Hydrochlorothiazide Other (See Comments)    ED, muscle cramps, gout   Lisinopril Cough    Past Medical History:  Diagnosis Date   Anxiety    Gout    Hypertension    Hyperuricemia 10/23/2018   Prediabetes 03/07/2018    Past Surgical History:  Procedure Laterality Date   KNEE ARTHROCENTESIS Left     Social History   Socioeconomic History   Marital status: Single    Spouse name: Not on file   Number of children: Not on file   Years of education: Not on file   Highest education level: Not on file  Occupational History   Not on file  Tobacco Use   Smoking status: Never   Smokeless tobacco: Never  Substance and Sexual Activity   Alcohol use: Yes    Alcohol/week: 25.0 standard drinks    Types: 25 Cans of beer per week   Drug use: No   Sexual activity: Yes  Other Topics Concern   Not on file  Social History Narrative   Not on file   Social Determinants of Health   Financial Resource Strain: Not on file  Food Insecurity: Not on file  Transportation Needs: Not on file  Physical Activity: Not on file  Stress: Not on file  Social Connections: Not on file    Family History  Problem Relation Age  of Onset   Heart failure Mother    Heart attack Mother    Hypertension Father     Health Maintenance  Topic Date Due   Hepatitis C Screening  Never done   TETANUS/TDAP  Never done   Zoster Vaccines- Shingrix (1 of 2) Never done   COLONOSCOPY (Pts 45-3yrs Insurance coverage will need to be confirmed)  Never done   HIV Screening  02/09/2023 (Originally 05/04/1984)   HPV VACCINES  Aged Out   INFLUENZA VACCINE  Discontinued   COVID-19 Vaccine  Discontinued     ----------------------------------------------------------------------------------------------------------------------------------------------------------------------------------------------------------------- Physical Exam BP (!) 144/77 (BP Location: Left Arm, Patient Position: Sitting, Cuff Size: Large)   Pulse 83   Ht 6' (1.829 m)   Wt 247 lb (112 kg)   SpO2 98%   BMI 33.50 kg/m   Physical Exam Constitutional:      Appearance: Normal appearance.  Eyes:     General: No scleral icterus. Cardiovascular:     Rate and Rhythm: Normal rate and regular rhythm.  Pulmonary:     Effort: Pulmonary effort is normal.     Breath sounds: Normal breath sounds.  Musculoskeletal:     Cervical back: Neck supple.  Skin:    General: Skin is warm and dry.  Neurological:     General: No focal deficit present.  Mental Status: He is alert.  Psychiatric:        Mood and Affect: Mood normal.        Behavior: Behavior normal.    ------------------------------------------------------------------------------------------------------------------------------------------------------------------------------------------------------------------- Assessment and Plan  Hypertension goal BP (blood pressure) < 130/80 Blood pressure fairly well controlled at this time.  He will continue current medications.  Low-sodium diet encouraged.  Hypogonadism in male Testosterone injection given today, 20 mg.  We will plan to repeat in 2 weeks.  Update  levels in about 3 months.  History of gout No recent gout flares.  Doing well with Uloric.   Meds ordered this encounter  Medications   testosterone cypionate (DEPOTESTOSTERONE CYPIONATE) injection 200 mg    Return in about 3 months (around 03/27/2021) for HTN/Testosterone.    This visit occurred during the SARS-CoV-2 public health emergency.  Safety protocols were in place, including screening questions prior to the visit, additional usage of staff PPE, and extensive cleaning of exam room while observing appropriate contact time as indicated for disinfecting solutions.

## 2021-01-09 ENCOUNTER — Ambulatory Visit: Payer: Commercial Managed Care - PPO

## 2021-01-16 ENCOUNTER — Other Ambulatory Visit: Payer: Self-pay

## 2021-01-16 ENCOUNTER — Ambulatory Visit (INDEPENDENT_AMBULATORY_CARE_PROVIDER_SITE_OTHER): Payer: Commercial Managed Care - PPO | Admitting: Family Medicine

## 2021-01-16 ENCOUNTER — Other Ambulatory Visit: Payer: Self-pay | Admitting: Family Medicine

## 2021-01-16 VITALS — BP 156/76 | HR 87 | Ht 72.0 in | Wt 250.0 lb

## 2021-01-16 DIAGNOSIS — I10 Essential (primary) hypertension: Secondary | ICD-10-CM

## 2021-01-16 DIAGNOSIS — E291 Testicular hypofunction: Secondary | ICD-10-CM

## 2021-01-16 MED ORDER — TESTOSTERONE CYPIONATE 200 MG/ML IM SOLN
200.0000 mg | Freq: Once | INTRAMUSCULAR | Status: AC
  Administered 2021-01-16: 200 mg via INTRAMUSCULAR

## 2021-01-16 NOTE — Progress Notes (Signed)
Okay sounds good.  The goal based on his age is a systolic under 130.

## 2021-01-16 NOTE — Progress Notes (Signed)
   Subjective:    Patient ID: Leon Henson, male    DOB: 1970-05-12, 50 y.o.   MRN: [id number]  HPI Patient is here for a testosterone injection. Denies any chest pain, shortness of breath, headaches and problems with medication or mood changes. Patient's BP was elevated, he was allowed to sit for 10 minutes and another reading was obtained.    Review of Systems     Objective:   Physical Exam        Assessment & Plan:  Patient tolerated injection well IM in the RUOQ without complications. In Dr. Ashley Royalty absence, BP reviewed with Dr. Linford Arnold. Patient was given the option to bring in BP log results from home of regular readings over the next 2 weeks to his next scheduled injection or "bump up" dose of medication. Patient chose to bring in readings from home as he states he gets 130s over 70-80s at home. Patient advised to schedule next injection in 14 days.

## 2021-01-30 ENCOUNTER — Other Ambulatory Visit: Payer: Self-pay

## 2021-01-30 ENCOUNTER — Ambulatory Visit (INDEPENDENT_AMBULATORY_CARE_PROVIDER_SITE_OTHER): Payer: Commercial Managed Care - PPO | Admitting: Sports Medicine

## 2021-01-30 VITALS — BP 127/73 | HR 92

## 2021-01-30 DIAGNOSIS — E291 Testicular hypofunction: Secondary | ICD-10-CM

## 2021-01-30 NOTE — Progress Notes (Signed)
Patient is here for testosterone injection. Denies chest pain, shortness of breath, headaches, and problems with medication or mood changes.   Patient tolerated testosterone 200 mg injection to RUOQ well without complications. Patient advised to schedule next injection in 14 days.   Patient's last blood pressure reading in the office was elevated. Reading today is 127/73. He states readings at home have been similar.

## 2021-02-13 ENCOUNTER — Ambulatory Visit (INDEPENDENT_AMBULATORY_CARE_PROVIDER_SITE_OTHER): Payer: Commercial Managed Care - PPO | Admitting: Sports Medicine

## 2021-02-13 VITALS — BP 135/73 | HR 104 | Ht 72.0 in | Wt 250.0 lb

## 2021-02-13 DIAGNOSIS — E291 Testicular hypofunction: Secondary | ICD-10-CM

## 2021-02-13 NOTE — Progress Notes (Signed)
Patient is here for a testosterone injection of 200mg.  ?Given in [facility] ? ?Denies chest pain, shortness of breath, headaches and problems with medication or mood changes.  ?Tolerated injection well without complications.  ? ?Patient advised to schedule next injection in 14 days.  ?

## 2021-02-27 ENCOUNTER — Ambulatory Visit: Payer: Commercial Managed Care - PPO

## 2021-03-13 ENCOUNTER — Other Ambulatory Visit: Payer: Self-pay

## 2021-03-13 ENCOUNTER — Ambulatory Visit (INDEPENDENT_AMBULATORY_CARE_PROVIDER_SITE_OTHER): Payer: Commercial Managed Care - PPO | Admitting: Family Medicine

## 2021-03-13 VITALS — BP 125/70 | HR 88

## 2021-03-13 DIAGNOSIS — E291 Testicular hypofunction: Secondary | ICD-10-CM

## 2021-03-13 MED ORDER — TESTOSTERONE CYPIONATE 200 MG/ML IM SOLN
200.0000 mg | INTRAMUSCULAR | Status: DC
  Administered 2021-03-13 – 2021-04-10 (×3): 200 mg via INTRAMUSCULAR

## 2021-03-13 NOTE — Progress Notes (Signed)
Agree with documentation as above.   Kamariah Fruchter, MD  

## 2021-03-13 NOTE — Progress Notes (Signed)
HPI:  Patient is here for a testosterone 200mg  injection.  Denies chest pains, shortness of breath, headaches and problems with medication or mood changes.  Assessment and Plan:  Patient tolerated injection well without complications.  Patient advised to schedule next injection in 14 days.  , CMA

## 2021-03-19 ENCOUNTER — Other Ambulatory Visit: Payer: Self-pay | Admitting: Family Medicine

## 2021-03-19 DIAGNOSIS — M1A9XX1 Chronic gout, unspecified, with tophus (tophi): Secondary | ICD-10-CM

## 2021-03-27 ENCOUNTER — Ambulatory Visit (INDEPENDENT_AMBULATORY_CARE_PROVIDER_SITE_OTHER): Payer: Commercial Managed Care - PPO | Admitting: Family Medicine

## 2021-03-27 ENCOUNTER — Other Ambulatory Visit: Payer: Self-pay

## 2021-03-27 VITALS — BP 140/82 | HR 82 | Temp 98.1°F | Ht 72.0 in | Wt 249.0 lb

## 2021-03-27 DIAGNOSIS — E291 Testicular hypofunction: Secondary | ICD-10-CM | POA: Diagnosis not present

## 2021-03-27 NOTE — Progress Notes (Signed)
Patient is here for a testosterone injection of 200mg/ml.  Given in RUOQ.  Denies chest pain, shortness of breath, headaches and problems with medication or mood changes.  Tolerated injection well without complications.   Patient advised to schedule next injection in 14 days.  

## 2021-03-29 NOTE — Progress Notes (Signed)
Agree with documentation as above.   Darlina Mccaughey, MD  

## 2021-04-02 ENCOUNTER — Encounter: Payer: Self-pay | Admitting: *Deleted

## 2021-04-02 NOTE — Progress Notes (Signed)
Sent my chart message to pt about [employer] research. Message also sent to Dr Everrett Coombe about research study.

## 2021-04-03 ENCOUNTER — Ambulatory Visit: Payer: Commercial Managed Care - PPO | Admitting: Family Medicine

## 2021-04-10 ENCOUNTER — Ambulatory Visit (INDEPENDENT_AMBULATORY_CARE_PROVIDER_SITE_OTHER): Payer: Commercial Managed Care - PPO | Admitting: Physician Assistant

## 2021-04-10 ENCOUNTER — Other Ambulatory Visit: Payer: Self-pay

## 2021-04-10 VITALS — BP 138/78 | HR 92 | Temp 98.6°F

## 2021-04-10 DIAGNOSIS — E291 Testicular hypofunction: Secondary | ICD-10-CM | POA: Diagnosis not present

## 2021-04-10 NOTE — Progress Notes (Signed)
Patient denies chest pain, shortness of breath and dizziness.

## 2021-04-14 ENCOUNTER — Telehealth: Payer: Self-pay | Admitting: *Deleted

## 2021-04-14 NOTE — Telephone Encounter (Signed)
Spoke with Leon Henson about core research. Info given pt refuses any further info.

## 2021-04-17 ENCOUNTER — Other Ambulatory Visit: Payer: Self-pay | Admitting: Family Medicine

## 2021-04-17 DIAGNOSIS — I10 Essential (primary) hypertension: Secondary | ICD-10-CM

## 2021-04-24 ENCOUNTER — Ambulatory Visit: Payer: Commercial Managed Care - PPO

## 2021-05-30 ENCOUNTER — Other Ambulatory Visit: Payer: Self-pay | Admitting: Family Medicine

## 2021-05-31 ENCOUNTER — Encounter: Payer: Self-pay | Admitting: Family Medicine

## 2021-06-01 NOTE — Telephone Encounter (Signed)
Pls contact pt to schedule 30-month follow-up appt with Dr. Zigmund Daniel. Sending 30 day refill.

## 2021-06-24 ENCOUNTER — Other Ambulatory Visit: Payer: Self-pay | Admitting: Family Medicine

## 2021-10-07 ENCOUNTER — Telehealth: Payer: Self-pay | Admitting: Family Medicine

## 2021-10-07 DIAGNOSIS — R7303 Prediabetes: Secondary | ICD-10-CM

## 2021-10-07 DIAGNOSIS — Z79899 Other long term (current) drug therapy: Secondary | ICD-10-CM

## 2021-10-07 DIAGNOSIS — E291 Testicular hypofunction: Secondary | ICD-10-CM

## 2021-10-07 DIAGNOSIS — E782 Mixed hyperlipidemia: Secondary | ICD-10-CM

## 2021-10-07 DIAGNOSIS — I10 Essential (primary) hypertension: Secondary | ICD-10-CM

## 2021-10-07 NOTE — Telephone Encounter (Signed)
Please advise pt that labs have been ordered. They should be completed prior to 10am due to testosterone lab order. Thanks

## 2021-10-07 NOTE — Telephone Encounter (Signed)
Patient is scheduled for an appointment on 7/27 but would like blood work due before. Please advise when labs have been ordered

## 2021-10-08 NOTE — Telephone Encounter (Signed)
Patient is aware. Offered to schedule appt but patient was not sure what day he would be coming in for labs. gh

## 2021-11-05 ENCOUNTER — Encounter: Payer: Self-pay | Admitting: Family Medicine

## 2021-11-05 ENCOUNTER — Ambulatory Visit: Payer: Commercial Managed Care - PPO | Admitting: Family Medicine

## 2021-11-05 DIAGNOSIS — E790 Hyperuricemia without signs of inflammatory arthritis and tophaceous disease: Secondary | ICD-10-CM

## 2021-11-05 DIAGNOSIS — F411 Generalized anxiety disorder: Secondary | ICD-10-CM | POA: Diagnosis not present

## 2021-11-05 DIAGNOSIS — I10 Essential (primary) hypertension: Secondary | ICD-10-CM

## 2021-11-05 DIAGNOSIS — E782 Mixed hyperlipidemia: Secondary | ICD-10-CM

## 2021-11-05 DIAGNOSIS — R7303 Prediabetes: Secondary | ICD-10-CM | POA: Diagnosis not present

## 2021-11-05 DIAGNOSIS — M1A9XX1 Chronic gout, unspecified, with tophus (tophi): Secondary | ICD-10-CM

## 2021-11-05 MED ORDER — ATORVASTATIN CALCIUM 20 MG PO TABS: 20.0000 mg | ORAL_TABLET | Freq: Every day | ORAL | 3 refills | Status: DC

## 2021-11-05 MED ORDER — FEBUXOSTAT 80 MG PO TABS: 1.0000 | ORAL_TABLET | Freq: Every day | ORAL | 3 refills | Status: DC

## 2021-11-05 MED ORDER — AMLODIPINE BESYLATE-VALSARTAN 10-160 MG PO TABS: 1.0000 | ORAL_TABLET | Freq: Every day | ORAL | 3 refills | Status: DC

## 2021-11-05 MED ORDER — CITALOPRAM HYDROBROMIDE 10 MG PO TABS
10.0000 mg | ORAL_TABLET | Freq: Every day | ORAL | 3 refills | Status: DC
Start: 2021-11-05 — End: 2022-11-23

## 2021-11-05 NOTE — Assessment & Plan Note (Signed)
Tolerating atorvastatin well.  Updated lipid and lft's ordered.

## 2021-11-05 NOTE — Assessment & Plan Note (Signed)
Updating A1c. 

## 2021-11-05 NOTE — Assessment & Plan Note (Signed)
Anxiety is well controlled with citalopram daily with alprazolam as needed.  We'll plan to continue these medications.

## 2021-11-05 NOTE — Assessment & Plan Note (Signed)
Mild elevation of BP in clinic today.  Recommend continuation of current medication and monitoring of BP at home.

## 2021-11-05 NOTE — Progress Notes (Signed)
Leon Henson - 51 y.o. male MRN [id number]  Date of birth: Sep 23, 1970  Subjective Chief Complaint  Patient presents with   Hypertension    HPI Leon Henson is a 51 y.o. male here today for follow up visit.    BP is currently managed with amlodipine/valsartan 10/160mg .  Tolerating well without side effects. He denies symptoms related to HTN including chest pain, shortness of breath, palpitations ,headache or vision changes.    Anxiety has been well controlled with celexa daily and alprazolam as needed.  Using alprazolam sparingly.  Stable symptoms at this time.   No recent gout flares.  Continues on uloric with colchicine as needed.    Tolerating atorvastatin well for lipids.  Due for updated labs.   ROS:  A comprehensive ROS was completed and negative except as noted per HPI  Allergies  Allergen Reactions   Hydrochlorothiazide Other (See Comments)    ED, muscle cramps, gout   Lisinopril Cough    Past Medical History:  Diagnosis Date   Anxiety    Gout    Hypertension    Hyperuricemia 10/23/2018   Prediabetes 03/07/2018    Past Surgical History:  Procedure Laterality Date   KNEE ARTHROCENTESIS Left     Social History   Socioeconomic History   Marital status: Single    Spouse name: Not on file   Number of children: Not on file   Years of education: Not on file   Highest education level: Not on file  Occupational History   Not on file  Tobacco Use   Smoking status: Never   Smokeless tobacco: Never  Substance and Sexual Activity   Alcohol use: Yes    Alcohol/week: 25.0 standard drinks of alcohol    Types: 25 Cans of beer per week   Drug use: No   Sexual activity: Yes  Other Topics Concern   Not on file  Social History Narrative   Not on file   Social Determinants of Health   Financial Resource Strain: Not on file  Food Insecurity: Not on file  Transportation Needs: Not on file  Physical Activity: Not on file  Stress: Not on file  Social Connections:  Not on file    Family History  Problem Relation Age of Onset   Heart failure Mother    Heart attack Mother    Hypertension Father     Health Maintenance  Topic Date Due   Zoster Vaccines- Shingrix (1 of 2) 02/05/2022 (Originally 05/04/1988)   COLONOSCOPY (Pts 45-93yrs Insurance coverage will need to be confirmed)  11/06/2022 (Originally 05/04/2014)   TETANUS/TDAP  11/06/2022 (Originally 05/04/1988)   Hepatitis C Screening  11/06/2022 (Originally 05/05/1987)   HIV Screening  02/09/2023 (Originally 05/04/1984)   HPV VACCINES  Aged Out   INFLUENZA VACCINE  Discontinued   COVID-19 Vaccine  Discontinued     ----------------------------------------------------------------------------------------------------------------------------------------------------------------------------------------------------------------- Physical Exam BP (!) 143/74   Pulse 72   Ht 6' (1.829 m)   Wt 247 lb (112 kg)   SpO2 98%   BMI 33.50 kg/m   Physical Exam Constitutional:      Appearance: Normal appearance.  Eyes:     General: No scleral icterus. Cardiovascular:     Rate and Rhythm: Normal rate and regular rhythm.  Pulmonary:     Effort: Pulmonary effort is normal.     Breath sounds: Normal breath sounds.  Musculoskeletal:     Cervical back: Neck supple.  Neurological:     Mental Status: He is alert.  Psychiatric:  Mood and Affect: Mood normal.        Behavior: Behavior normal.     ------------------------------------------------------------------------------------------------------------------------------------------------------------------------------------------------------------------- Assessment and Plan  GAD (generalized anxiety disorder) Anxiety is well controlled with citalopram daily with alprazolam as needed.  We'll plan to continue these medications.    Prediabetes Updating A1c.   Mixed hyperlipidemia Tolerating atorvastatin well.  Updated lipid and lft's ordered.     Hyperuricemia No recent gout flares.  Continue uloric at current strength with colchicine as needed.    Hypertension goal BP (blood pressure) < 130/80 Mild elevation of BP in clinic today.  Recommend continuation of current medication and monitoring of BP at home.     Meds ordered this encounter  Medications   amLODipine-valsartan (EXFORGE) 10-160 MG tablet    Sig: Take 1 tablet by mouth daily.    Dispense:  90 tablet    Refill:  3   citalopram (CELEXA) 10 MG tablet    Sig: Take 1 tablet (10 mg total) by mouth daily.    Dispense:  90 tablet    Refill:  3   Febuxostat 80 MG TABS    Sig: Take 1 tablet (80 mg total) by mouth daily.    Dispense:  90 tablet    Refill:  3   atorvastatin (LIPITOR) 20 MG tablet    Sig: Take 1 tablet (20 mg total) by mouth daily.    Dispense:  90 tablet    Refill:  3    Return in about 6 months (around 05/08/2022) for HTN.    This visit occurred during the SARS-CoV-2 public health emergency.  Safety protocols were in place, including screening questions prior to the visit, additional usage of staff PPE, and extensive cleaning of exam room while observing appropriate contact time as indicated for disinfecting solutions.

## 2021-11-05 NOTE — Assessment & Plan Note (Signed)
No recent gout flares.  Continue uloric at current strength with colchicine as needed.

## 2021-11-07 LAB — LIPID PANEL
Cholesterol: 198 mg/dL (ref <200)
HDL: 38 mg/dL — ABNORMAL LOW (ref >=40)
Non-HDL Cholesterol (Calc): 160 mg/dL (calc) — ABNORMAL HIGH (ref <130)
Total CHOL/HDL Ratio: 5.2 (calc) — ABNORMAL HIGH (ref <5.0)
Triglycerides: 793 mg/dL — ABNORMAL HIGH (ref <150)

## 2021-11-07 LAB — CBC
HCT: 46.5 % (ref 38.5–50.0)
Hemoglobin: 15.8 g/dL (ref 13.2–17.1)
MCH: 31.2 pg (ref 27.0–33.0)
MCHC: 34 g/dL (ref 32.0–36.0)
MCV: 91.9 fL (ref 80.0–100.0)
MPV: 10.3 fL (ref 7.5–12.5)
Platelets: 281 10*3/uL (ref 140–400)
RBC: 5.06 10*6/uL (ref 4.20–5.80)
RDW: 13.1 % (ref 11.0–15.0)
WBC: 5.4 10*3/uL (ref 3.8–10.8)

## 2021-11-07 LAB — COMPREHENSIVE METABOLIC PANEL
AG Ratio: 1.6 (calc) (ref 1.0–2.5)
ALT: 38 U/L (ref 9–46)
AST: 36 U/L — ABNORMAL HIGH (ref 10–35)
Albumin: 4.7 g/dL (ref 3.6–5.1)
Alkaline phosphatase (APISO): 81 U/L (ref 35–144)
BUN: 15 mg/dL (ref 7–25)
CO2: 25 mmol/L (ref 20–32)
Calcium: 9.7 mg/dL (ref 8.6–10.3)
Chloride: 102 mmol/L (ref 98–110)
Creat: 0.95 mg/dL (ref 0.70–1.30)
Globulin: 2.9 g/dL (calc) (ref 1.9–3.7)
Glucose, Bld: 107 mg/dL — ABNORMAL HIGH (ref 65–99)
Potassium: 4.3 mmol/L (ref 3.5–5.3)
Sodium: 138 mmol/L (ref 135–146)
Total Bilirubin: 0.6 mg/dL (ref 0.2–1.2)
Total Protein: 7.6 g/dL (ref 6.1–8.1)

## 2021-11-07 LAB — TESTOSTERONE: Testosterone: 210 ng/dL — ABNORMAL LOW (ref 250–827)

## 2021-11-07 LAB — HEMOGLOBIN A1C
Hgb A1c MFr Bld: 5.7 % of total Hgb — ABNORMAL HIGH (ref <5.7)
Mean Plasma Glucose: 117 mg/dL
eAG (mmol/L): 6.5 mmol/L

## 2021-12-18 ENCOUNTER — Ambulatory Visit
Admission: RE | Admit: 2021-12-18 | Discharge: 2021-12-18 | Disposition: A | Discharge status: Home / Self Care | Payer: Commercial Managed Care - PPO | Source: Ambulatory Visit | Attending: Family Medicine | Admitting: Family Medicine

## 2021-12-18 VITALS — BP 134/83 | HR 98 | Temp 99.4°F | Resp 18 | Ht 72.0 in | Wt 235.0 lb

## 2021-12-18 DIAGNOSIS — J309 Allergic rhinitis, unspecified: Secondary | ICD-10-CM | POA: Diagnosis not present

## 2021-12-18 DIAGNOSIS — R059 Cough, unspecified: Secondary | ICD-10-CM

## 2021-12-18 DIAGNOSIS — J0100 Acute maxillary sinusitis, unspecified: Secondary | ICD-10-CM | POA: Diagnosis not present

## 2021-12-18 MED ORDER — BENZONATATE 200 MG PO CAPS: 200.0000 mg | ORAL_CAPSULE | Freq: Three times a day (TID) | ORAL | 0 refills | Status: AC | PRN

## 2021-12-18 MED ORDER — FEXOFENADINE HCL 180 MG PO TABS: 180.0000 mg | ORAL_TABLET | Freq: Every day | ORAL | 0 refills | Status: DC

## 2021-12-18 MED ORDER — PREDNISONE 20 MG PO TABS: ORAL_TABLET | ORAL | 0 refills | Status: DC

## 2021-12-18 MED ORDER — AMOXICILLIN-POT CLAVULANATE 875-125 MG PO TABS: 1.0000 | ORAL_TABLET | Freq: Two times a day (BID) | ORAL | 0 refills | Status: DC

## 2021-12-18 NOTE — ED Triage Notes (Signed)
Cough x 2 weeks  OTC  alka-seltzer cough  & cold, Mucinex DM - no relief Vicks  vapor rub & cough drops Denies fever

## 2021-12-18 NOTE — Discharge Instructions (Addendum)
Instructed patient to take medication as directed with food to completion.  Advised patient to take prednisone and Allegra with first dose of Augmentin for the next 5 of 7 days.  Advised may use Allegra as needed afterwards for concurrent postnasal drainage/drip.  Advised may use Tessalon Perles daily or as needed for cough.  Encouraged patient to increase daily water intake while taking these medications.  Advised patient if symptoms worsen and/or unresolved please follow-up with PCP or here for further evaluation.
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
## 2021-12-19 ENCOUNTER — Telehealth: Payer: Self-pay

## 2021-12-19 NOTE — Telephone Encounter (Signed)
TC to f/u after yesterday's visit to [facility]. No answer; left VM to call [phone] for problems or questions. 

## 2022-03-22 ENCOUNTER — Other Ambulatory Visit: Payer: Self-pay | Admitting: Family Medicine

## 2022-03-22 DIAGNOSIS — F4322 Adjustment disorder with anxiety: Secondary | ICD-10-CM

## 2022-03-23 MED ORDER — ALPRAZOLAM 0.5 MG PO TABS: 0.5000 mg | ORAL_TABLET | Freq: Once | ORAL | 0 refills | Status: DC | PRN

## 2022-05-14 ENCOUNTER — Ambulatory Visit: Payer: Commercial Managed Care - PPO | Admitting: Family Medicine

## 2022-05-14 ENCOUNTER — Encounter: Payer: Self-pay | Admitting: Family Medicine

## 2022-05-14 VITALS — BP 147/83 | HR 78 | Ht 72.0 in | Wt 240.0 lb

## 2022-05-14 DIAGNOSIS — R7303 Prediabetes: Secondary | ICD-10-CM | POA: Diagnosis not present

## 2022-05-14 DIAGNOSIS — R5383 Other fatigue: Secondary | ICD-10-CM | POA: Diagnosis not present

## 2022-05-14 DIAGNOSIS — I10 Essential (primary) hypertension: Secondary | ICD-10-CM | POA: Diagnosis not present

## 2022-05-14 NOTE — Progress Notes (Signed)
Leon Henson - 52 y.o. male MRN [id number]  Date of birth: July 19, 1970  Subjective Chief Complaint  Patient presents with  . Hypertension    HPI Leon Henson is a 52 y.o. male here today for follow up.   He reports that he has had more stress at work.  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  He is using alprazolam a little more frequently but tries to only limit this to times of severe anxiety.  He does continue on citalopram.  He denies side effects from medications.    Continues on exforge for management of HTN.  Tolerating well without side effects.  He denies symptoms related to HTN.  BP at home is better controlled than readings we have gotten in clinic.  He has not had chest pain, shortness of breath, palpitations, headache or vision changes.   Tolerating atorvastatin well for management of HTN. Allergies  Allergen Reactions  . Hydrochlorothiazide Other (See Comments)    ED, muscle cramps, gout  . Lisinopril Cough    Past Medical History:  Diagnosis Date  . Anxiety   . Gout   . Hypertension   . Hyperuricemia 10/23/2018  . Prediabetes 03/07/2018    Past Surgical History:  Procedure Laterality Date  . KNEE ARTHROCENTESIS Left     Social History   Socioeconomic History  . Marital status: Single    Spouse name: Not on file  . Number of children: Not on file  . Years of education: Not on file  . Highest education level: Not on file  Occupational History  . Not on file  Tobacco Use  . Smoking status: Never  . Smokeless tobacco: Never  Vaping Use  . Vaping Use: Never used  Substance and Sexual Activity  . Alcohol use: Yes    Alcohol/week: 24.0 standard drinks of alcohol    Types: 24 Cans of beer per week  . Drug use: No  . Sexual activity: Yes  Other Topics Concern  . Not on file  Social History Narrative  . Not on file   Social Determinants of Health   Financial Resource Strain: Not on file  Food Insecurity: Not on file   Transportation Needs: Not on file  Physical Activity: Not on file  Stress: Not on file  Social Connections: Not on file    Family History  Problem Relation Age of Onset  . Heart failure Mother   . Heart attack Mother   . Hypertension Father     Health Maintenance  Topic Date Due  . DTaP/Tdap/Td (1 - Tdap) Never done  . Zoster Vaccines- Shingrix (1 of 2) 08/12/2022 (Originally 05/04/1988)  . COLONOSCOPY (Pts 45-41yrs Insurance coverage will need to be confirmed)  11/06/2022 (Originally 05/04/2014)  . Hepatitis C Screening  11/06/2022 (Originally 05/05/1987)  . HIV Screening  02/09/2023 (Originally 05/04/1984)  . HPV VACCINES  Aged Out  . INFLUENZA VACCINE  Discontinued  . COVID-19 Vaccine  Discontinued     ----------------------------------------------------------------------------------------------------------------------------------------------------------------------------------------------------------------- Physical Exam BP (!) 152/108 (BP Location: Left Arm, Patient Position: Sitting, Cuff Size: Large)   Pulse 78   Ht 6' (1.829 m)   Wt 240 lb (108.9 kg)   SpO2 99%   BMI 32.55 kg/m   Physical Exam  ------------------------------------------------------------------------------------------------------------------------------------------------------------------------------------------------------------------- Assessment and Plan  No problem-specific Assessment & Plan notes found for this encounter.   No orders of the defined types were placed in this encounter.   No follow-ups on file.    This  visit occurred during the SARS-CoV-2 public health emergency.  Safety protocols were in place, including screening questions prior to the visit, additional usage of staff PPE, and extensive cleaning of exam room while observing appropriate contact time as indicated for disinfecting solutions.

## 2022-05-15 LAB — CBC WITH DIFFERENTIAL/PLATELET
Absolute Monocytes: 552 cells/uL (ref 200–950)
Basophils Absolute: 62 cells/uL (ref 0–200)
Basophils Relative: 1 %
Eosinophils Absolute: 149 cells/uL (ref 15–500)
Eosinophils Relative: 2.4 %
HCT: 44.1 % (ref 38.5–50.0)
Hemoglobin: 15.4 g/dL (ref 13.2–17.1)
Lymphs Abs: 1457 cells/uL (ref 850–3900)
MCH: 31.2 pg (ref 27.0–33.0)
MCHC: 34.9 g/dL (ref 32.0–36.0)
MCV: 89.5 fL (ref 80.0–100.0)
MPV: 10.4 fL (ref 7.5–12.5)
Monocytes Relative: 8.9 %
Neutro Abs: 3980 cells/uL (ref 1500–7800)
Neutrophils Relative %: 64.2 %
Platelets: 242 10*3/uL (ref 140–400)
RBC: 4.93 10*6/uL (ref 4.20–5.80)
RDW: 12.9 % (ref 11.0–15.0)
Total Lymphocyte: 23.5 %
WBC: 6.2 10*3/uL (ref 3.8–10.8)

## 2022-05-15 LAB — TSH: TSH: 1.4 mIU/L (ref 0.40–4.50)

## 2022-05-15 LAB — HEMOGLOBIN A1C
Hgb A1c MFr Bld: 6 % of total Hgb — ABNORMAL HIGH (ref <5.7)
Mean Plasma Glucose: 126 mg/dL
eAG (mmol/L): 7 mmol/L

## 2022-05-15 LAB — COMPLETE METABOLIC PANEL WITH GFR
AG Ratio: 1.5 (calc) (ref 1.0–2.5)
ALT: 30 U/L (ref 9–46)
AST: 37 U/L — ABNORMAL HIGH (ref 10–35)
Albumin: 4.4 g/dL (ref 3.6–5.1)
Alkaline phosphatase (APISO): 80 U/L (ref 35–144)
BUN: 15 mg/dL (ref 7–25)
CO2: 23 mmol/L (ref 20–32)
Calcium: 9.2 mg/dL (ref 8.6–10.3)
Chloride: 104 mmol/L (ref 98–110)
Creat: 0.79 mg/dL (ref 0.70–1.30)
Globulin: 2.9 g/dL (calc) (ref 1.9–3.7)
Glucose, Bld: 118 mg/dL — ABNORMAL HIGH (ref 65–99)
Potassium: 4.2 mmol/L (ref 3.5–5.3)
Sodium: 139 mmol/L (ref 135–146)
Total Bilirubin: 0.5 mg/dL (ref 0.2–1.2)
Total Protein: 7.3 g/dL (ref 6.1–8.1)
eGFR: 106 mL/min/{1.73_m2} (ref >=60)

## 2022-05-15 LAB — IRON,TIBC AND FERRITIN PANEL
%SAT: 19 % (calc) — ABNORMAL LOW (ref 20–48)
Ferritin: 264 ng/mL (ref 38–380)
Iron: 66 ug/dL (ref 50–180)
TIBC: 347 mcg/dL (calc) (ref 250–425)

## 2022-05-15 LAB — VITAMIN B12: Vitamin B-12: 265 pg/mL (ref 200–1100)

## 2022-05-15 LAB — VITAMIN D 25 HYDROXY (VIT D DEFICIENCY, FRACTURES): Vit D, 25-Hydroxy: 16 ng/mL — ABNORMAL LOW (ref 30–100)

## 2022-05-16 NOTE — Assessment & Plan Note (Signed)
Blood pressure is elevated today in clinic.  Second blood pressure improved from initial blood pressure.  He will check readings at home and send me readings over the next couple weeks.  Continue current medication at this time.

## 2022-05-16 NOTE — Assessment & Plan Note (Signed)
Update A1c ?

## 2022-06-08 ENCOUNTER — Ambulatory Visit (INDEPENDENT_AMBULATORY_CARE_PROVIDER_SITE_OTHER): Payer: Commercial Managed Care - PPO

## 2022-06-08 ENCOUNTER — Ambulatory Visit
Admission: RE | Admit: 2022-06-08 | Discharge: 2022-06-08 | Disposition: A | Discharge status: Home / Self Care | Payer: Commercial Managed Care - PPO | Source: Ambulatory Visit | Attending: Family Medicine | Admitting: Family Medicine

## 2022-06-08 VITALS — BP 158/91 | HR 80 | Temp 98.0°F | Resp 17

## 2022-06-08 DIAGNOSIS — M25532 Pain in left wrist: Secondary | ICD-10-CM | POA: Diagnosis not present

## 2022-06-08 DIAGNOSIS — S63502A Unspecified sprain of left wrist, initial encounter: Secondary | ICD-10-CM
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
## 2022-06-08 NOTE — ED Triage Notes (Signed)
Pt c/o LT wrist pain since last Friday when he fell catching himself with his wrist. Swollen and painful.

## 2022-06-08 NOTE — Discharge Instructions (Signed)
Continue with ice.  Aleve twice a day. Wear brace if it helps with pain Limit heavy use of hand See Dr. Dianah Field if you fail to improve

## 2022-09-29 ENCOUNTER — Other Ambulatory Visit: Payer: Self-pay

## 2022-09-29 MED ORDER — COLCHICINE 0.6 MG PO TABS: ORAL_TABLET | ORAL | 1 refills | Status: DC

## 2022-11-12 ENCOUNTER — Ambulatory Visit: Payer: Commercial Managed Care - PPO | Admitting: Family Medicine

## 2022-11-21 ENCOUNTER — Other Ambulatory Visit: Payer: Self-pay | Admitting: Family Medicine

## 2022-11-21 DIAGNOSIS — M1A9XX1 Chronic gout, unspecified, with tophus (tophi): Secondary | ICD-10-CM

## 2022-11-21 DIAGNOSIS — I10 Essential (primary) hypertension: Secondary | ICD-10-CM

## 2022-12-13 ENCOUNTER — Other Ambulatory Visit: Payer: Self-pay | Admitting: Family Medicine

## 2022-12-13 DIAGNOSIS — F4322 Adjustment disorder with anxiety: Secondary | ICD-10-CM

## 2022-12-14 ENCOUNTER — Telehealth: Payer: Self-pay | Admitting: General Practice

## 2022-12-14 NOTE — Transitions of Care (Post Inpatient/ED Visit) (Signed)
   12/14/2022  Name: Leon Henson MRN: [id number] DOB: 1970/08/06  Today's TOC FU Call Status: Today's TOC FU Call Status:: Successful TOC FU Call Completed TOC FU Call Complete Date: 12/14/22 Patient's Name and Date of Birth confirmed.  Transition Care Management Follow-up Telephone Call Date of Discharge: 12/12/22 Discharge Facility: Other [facility]) Name of Other ([facility]) Discharge Facility: [facility] Type of Discharge: Emergency Department Reason for ED Visit: Cardiac Conditions Cardiac Conditions Diagnosis:  (hypertension) How have you been since you were released from the hospital?: Better Any questions or concerns?: No  Items Reviewed: Did you receive and understand the discharge instructions provided?: Yes Medications obtained,verified, and reconciled?: Yes (Medications Reviewed) Any new allergies since your discharge?: No Dietary orders reviewed?: NA Do you have support at home?: Yes  Medications Reviewed Today: Medications Reviewed Today     Reviewed by Modesto Charon, RN (Registered Nurse) on 12/14/22 at 1510  Med List Status: <None>   Medication Order Taking? Sig Documenting Provider Last Dose Status Informant  ALPRAZolam (XANAX) 0.5 MG tablet [id number] No Take 1 tablet (0.5 mg total) by mouth once as needed for up to 1 dose for anxiety. Everrett Coombe, DO Taking Active   amLODipine-valsartan [facility]) 10-160 MG tablet [id number]  TAKE 1 TABLET BY MOUTH EVERY DAY Everrett Coombe, DO  Active   atorvastatin (LIPITOR) 20 MG tablet [id number] No Take 1 tablet (20 mg total) by mouth daily. Everrett Coombe, DO Taking Active   citalopram (CELEXA) 10 MG tablet [id number]  TAKE 1 TABLET BY MOUTH EVERY DAY Everrett Coombe, DO  Active   colchicine 0.6 MG tablet [id number]  TAKE 1 TABLET 1-2 TIMES A DAY FOR GOUT PREVENTION AND TREATMENT Everrett Coombe, DO  Active   Febuxostat 80 MG TABS [id number]  TAKE 1 TABLET BY MOUTH EVERY DAY Everrett Coombe, DO  Active             Home  Care and Equipment/Supplies: Were Home Health Services Ordered?: NA Any new equipment or medical supplies ordered?: NA  Functional Questionnaire: Do you need assistance with bathing/showering or dressing?: No Do you need assistance with meal preparation?: No Do you need assistance with eating?: No Do you have difficulty maintaining continence: No Do you need assistance with getting out of bed/getting out of a chair/moving?: No Do you have difficulty managing or taking your medications?: No  Follow up appointments reviewed: PCP Follow-up appointment confirmed?: Yes Date of PCP follow-up appointment?: 12/15/22 Follow-up Provider: [facility] Follow-up appointment confirmed?: NA Do you need transportation to your follow-up appointment?: No Do you understand care options if your condition(s) worsen?: Yes-patient verbalized understanding  SDOH Interventions Today    Flowsheet Row Most Recent Value  SDOH Interventions   Transportation Interventions Intervention Not Indicated       SIGNATURE Modesto Charon, RN BSN Nurse Health Advisor

## 2022-12-15 ENCOUNTER — Encounter: Payer: Self-pay | Admitting: Family Medicine

## 2022-12-15 ENCOUNTER — Ambulatory Visit: Payer: Commercial Managed Care - PPO | Admitting: Family Medicine

## 2022-12-15 VITALS — BP 132/72 | HR 87 | Ht 72.0 in | Wt 247.0 lb

## 2022-12-15 DIAGNOSIS — R7309 Other abnormal glucose: Secondary | ICD-10-CM | POA: Diagnosis not present

## 2022-12-15 DIAGNOSIS — R030 Elevated blood-pressure reading, without diagnosis of hypertension: Secondary | ICD-10-CM | POA: Diagnosis not present

## 2022-12-15 DIAGNOSIS — I10 Essential (primary) hypertension: Secondary | ICD-10-CM

## 2022-12-15 MED ORDER — ALPRAZOLAM 0.5 MG PO TABS
0.5000 mg | ORAL_TABLET | Freq: Once | ORAL | 0 refills | Status: AC | PRN
Start: 2022-12-15 — End: ?

## 2022-12-19 DIAGNOSIS — R7309 Other abnormal glucose: Secondary | ICD-10-CM | POA: Insufficient documentation

## 2022-12-19 NOTE — Assessment & Plan Note (Signed)
Checking A1c today.

## 2022-12-19 NOTE — Progress Notes (Signed)
Leon Henson - 52 y.o. male MRN [id number]  Date of birth: [birth year]  Subjective Chief Complaint  Patient presents with   Hospitalization Follow-up    HPI Leon Henson is a 52 year old male here today for follow-up of recent ED visit.  He was seen in the ED with complaint of dizziness, racing heart and not feeling well.  Reports that he was at work when he experienced the symptoms.  History of high blood pressure as well as anxiety.  Ruled out for ACS in the ED.  He has had this on a few other occasions as well.  His blood pressure has been elevated during these episodes as well.  He feels like his anxiety has not really beenh worse and he did not get any significant improvement with alprazolam.  TSH was normal in the ED as well.  He reports he feels okay today.  Blood pressure is better controlled.  ROS:  A comprehensive ROS was completed and negative except as noted per HPI  Allergies  Allergen Reactions   Hydrochlorothiazide Other (See Comments)    ED, muscle cramps, gout   Lisinopril Cough    Past Medical History:  Diagnosis Date   Anxiety    Gout    Hypertension    Hyperuricemia 10/23/2018   Prediabetes 03/07/2018    Past Surgical History:  Procedure Laterality Date   KNEE ARTHROCENTESIS Left     Social History   Socioeconomic History   Marital status: Single    Spouse name: Not on file   Number of children: Not on file   Years of education: Not on file   Highest education level: Bachelor's degree (e.g., BA, AB, BS)  Occupational History   Not on file  Tobacco Use   Smoking status: Never   Smokeless tobacco: Never  Vaping Use   Vaping status: Never Used  Substance and Sexual Activity   Alcohol use: Yes    Alcohol/week: 24.0 standard drinks of alcohol    Types: 24 Cans of beer per week   Drug use: No   Sexual activity: Yes  Other Topics Concern   Not on file  Social History Narrative   Not on file   Social Determinants of Health   Financial Resource  Strain: Low Risk  (12/14/2022)   Overall Financial Resource Strain (CARDIA)    Difficulty of Paying Living Expenses: Not hard at all  Food Insecurity: No Food Insecurity (12/14/2022)   Hunger Vital Sign    Worried About Running Out of Food in the Last Year: Never true    Ran Out of Food in the Last Year: Never true  Transportation Needs: No Transportation Needs (12/14/2022)   PRAPARE - Administrator, Civil Service (Medical): No    Lack of Transportation (Non-Medical): No  Physical Activity: Insufficiently Active (12/14/2022)   Exercise Vital Sign    Days of Exercise per Week: 2 days    Minutes of Exercise per Session: 30 min  Stress: Stress Concern Present (12/14/2022)   [employer] of Occupational Health - Occupational Stress Questionnaire    Feeling of Stress : Very much  Social Connections: Moderately Isolated (12/14/2022)   Social Connection and Isolation Panel [NHANES]    Frequency of Communication with Friends and Family: More than three times a week    Frequency of Social Gatherings with Friends and Family: More than three times a week    Attends Religious Services: More than 4 times per year    Active Member of  Clubs or Organizations: No    Attends Engineer, structural: Not on file    Marital Status: Divorced    Family History  Problem Relation Age of Onset   Heart failure Mother    Heart attack Mother    Hypertension Father     Health Maintenance  Topic Date Due   DTaP/Tdap/Td (1 - Tdap) Never done   HIV Screening  02/09/2023 (Originally 05/04/1984)   Zoster Vaccines- Shingrix (1 of 2) 03/16/2023 (Originally 05/04/1988)   Colonoscopy  12/15/2023 (Originally 05/04/2014)   Hepatitis C Screening  12/15/2023 (Originally 05/05/1987)   HPV VACCINES  Aged Out   INFLUENZA VACCINE  Discontinued   COVID-19 Vaccine  Discontinued      ----------------------------------------------------------------------------------------------------------------------------------------------------------------------------------------------------------------- Physical Exam BP 132/72 (BP Location: Left Arm, Patient Position: Sitting, Cuff Size: Large)   Pulse 87   Ht 6' (1.829 m)   Wt 247 lb (112 kg)   SpO2 97%   BMI 33.50 kg/m   Physical Exam Constitutional:      Appearance: Normal appearance.  Eyes:     General: No scleral icterus. Cardiovascular:     Rate and Rhythm: Normal rate and regular rhythm.  Pulmonary:     Effort: Pulmonary effort is normal.     Breath sounds: Normal breath sounds.  Musculoskeletal:     Cervical back: Neck supple.  Neurological:     Mental Status: He is alert.  Psychiatric:        Mood and Affect: Mood normal.        Behavior: Behavior normal.     ------------------------------------------------------------------------------------------------------------------------------------------------------------------------------------------------------------------- Assessment and Plan  Hypertension goal BP (blood pressure) < 130/80 Blood pressure is well-controlled today however he has had spikes in blood pressure over the past several weeks.  He will continue current medications for now.  Thyroid function within normal limits.  Checking additional labs for evaluation of pheochromocytoma with his additional feelings of palpitations, dizziness as well as anxiety.  Elevated glucose Checking A1c today.   No orders of the defined types were placed in this encounter.   No follow-ups on file.    This visit occurred during the SARS-CoV-2 public health emergency.  Safety protocols were in place, including screening questions prior to the visit, additional usage of staff PPE, and extensive cleaning of exam room while observing appropriate contact time as indicated for disinfecting solutions.

## 2022-12-19 NOTE — Assessment & Plan Note (Signed)
Blood pressure is well-controlled today however he has had spikes in blood pressure over the past several weeks.  He will continue current medications for now.  Thyroid function within normal limits.  Checking additional labs for evaluation of pheochromocytoma with his additional feelings of palpitations, dizziness as well as anxiety.

## 2022-12-28 LAB — CATECHOLAMINES, FRACTIONATED, PLASMA
Dopamine: <30 pg/mL (ref 0–48)
Epinephrine: 26 pg/mL (ref 0–62)
Norepinephrine: 390 pg/mL (ref 0–874)

## 2022-12-28 LAB — HEMOGLOBIN A1C
Est. average glucose Bld gHb Est-mCnc: 131 mg/dL
Hgb A1c MFr Bld: 6.2 % — ABNORMAL HIGH (ref 4.8–5.6)

## 2022-12-29 LAB — CATECHOLAMINES, FRACTIONATED, URINE, 24 HOUR
Dopamine , 24H Ur: 101 ug/(24.h) (ref 0–510)
Dopamine, Rand Ur: 67 ug/L
Epinephrine, 24H Ur: <5 ug/(24.h) (ref 0–20)
Epinephrine, Rand Ur: <3 ug/L
Norepinephrine, 24H Ur: <23 ug/(24.h) (ref 0–135)
Norepinephrine, Rand Ur: <15 ug/L

## 2022-12-29 LAB — METANEPHRINES, URINE, 24 HOUR
Metaneph Total, Ur: 79 ug/L
Metanephrines, 24H Ur: 119 ug/(24.h) (ref 58–276)
Normetanephrine, 24H Ur: 560 ug/(24.h) (ref 156–729)
Normetanephrine, Ur: 373 ug/L

## 2023-01-10 IMAGING — CT CT ABD-PELV W/O CM
2 of 4 series · 16 of 46 positions shown, 18 images · non-contrast
Comparison: None.

CLINICAL DATA: Left lower quadrant pain for 2 weeks.

EXAM:
CT ABDOMEN AND PELVIS WITHOUT CONTRAST
TECHNIQUE: Multidetector CT imaging of the abdomen and pelvis was performed
following the standard protocol without IV contrast.

[Series 2: axial st · axial · 0.82mm/px · z∈[-506,-36]mm · 13 of 104 slices shown, 15 images]
[im 5/104  soft-tissue]
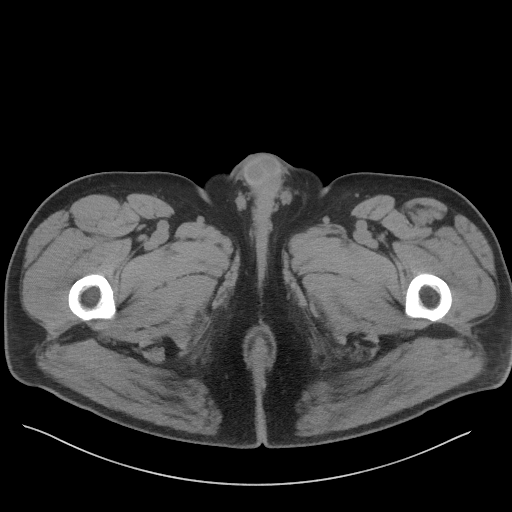
[im 5/104  bone]
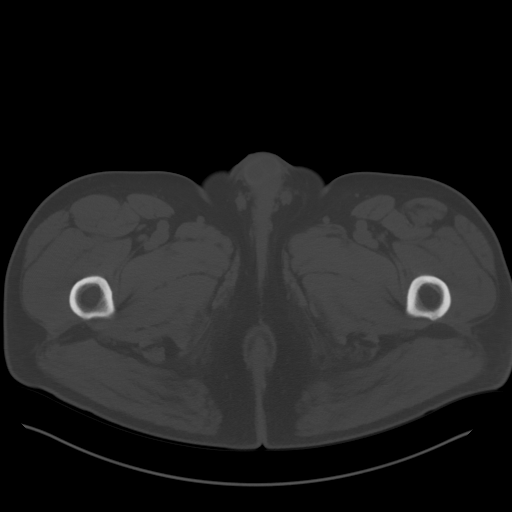
[im 13/104  soft-tissue]
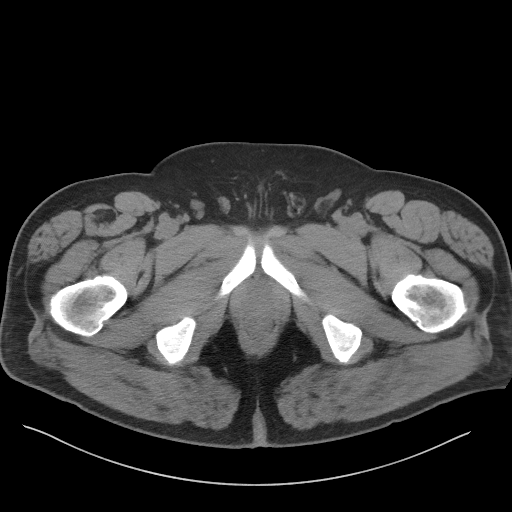
[im 22/104  soft-tissue]
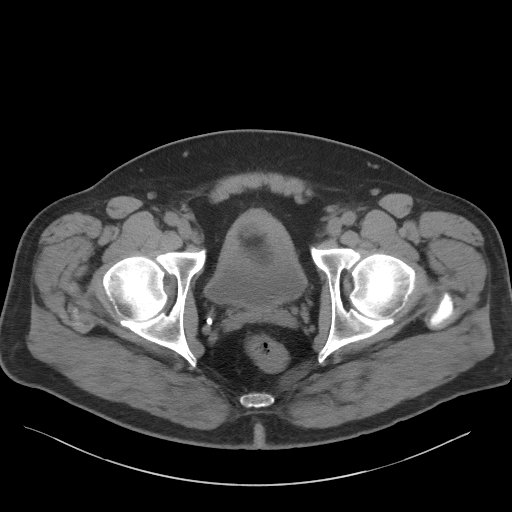
[im 31/104  soft-tissue]
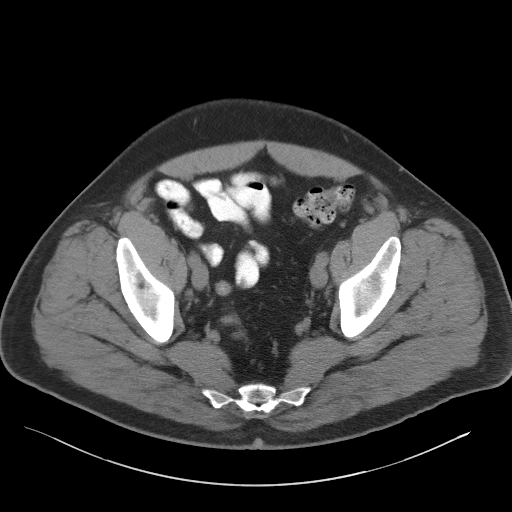
[im 35/104  soft-tissue]
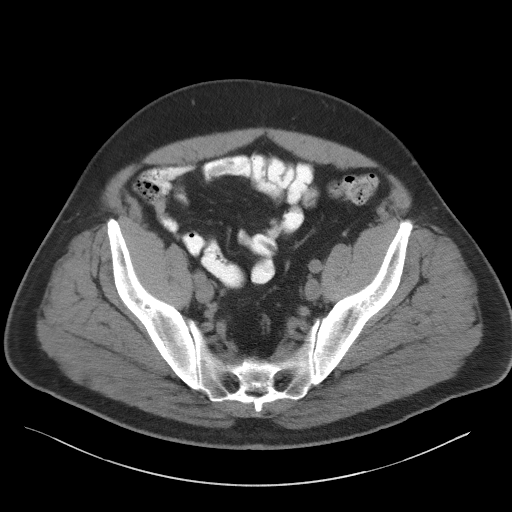
[im 43/104  soft-tissue]
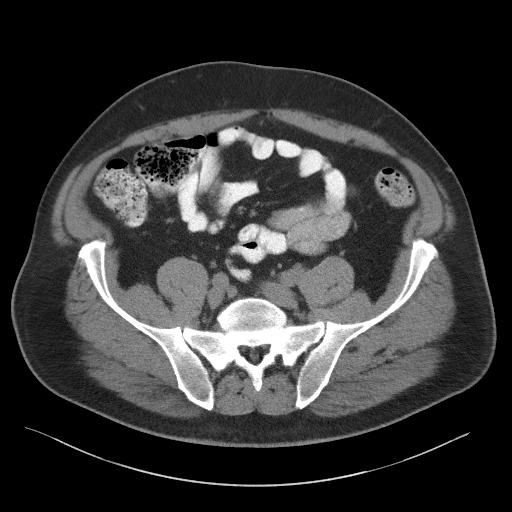
[im 52/104  soft-tissue]
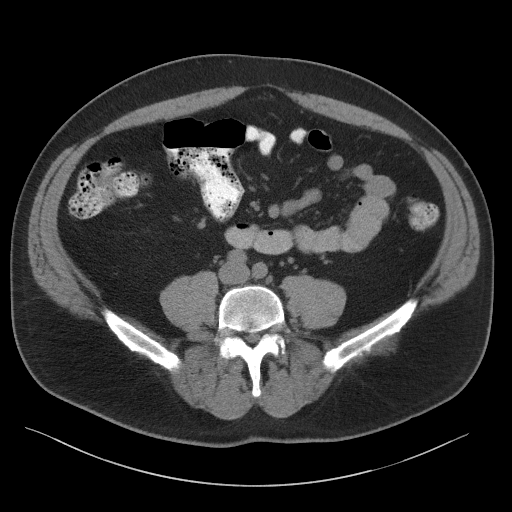
[im 61/104  soft-tissue]
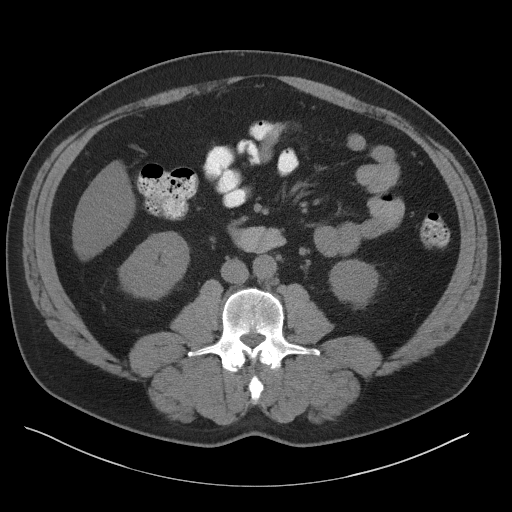
[im 69/104  soft-tissue]
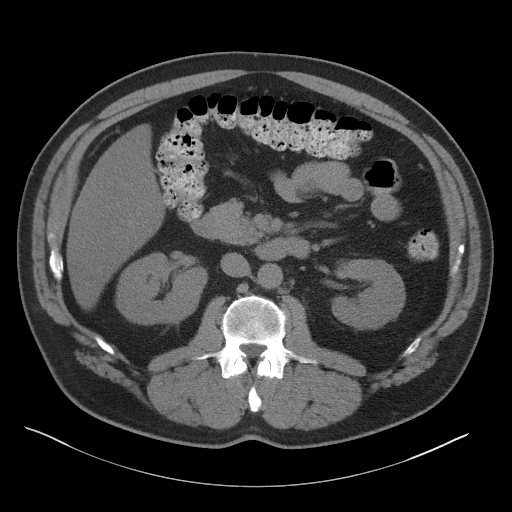
[im 69/104  bone]
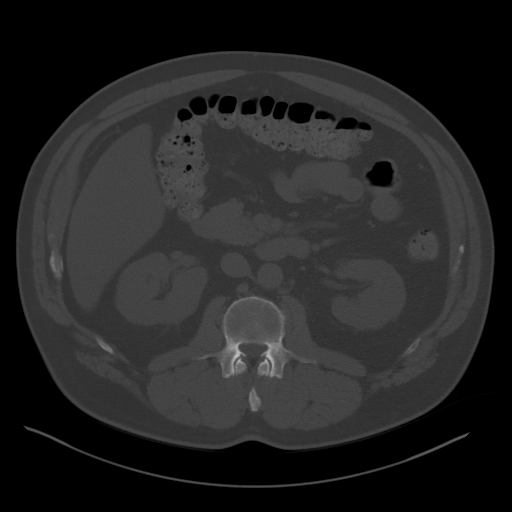
[im 73/104  soft-tissue]
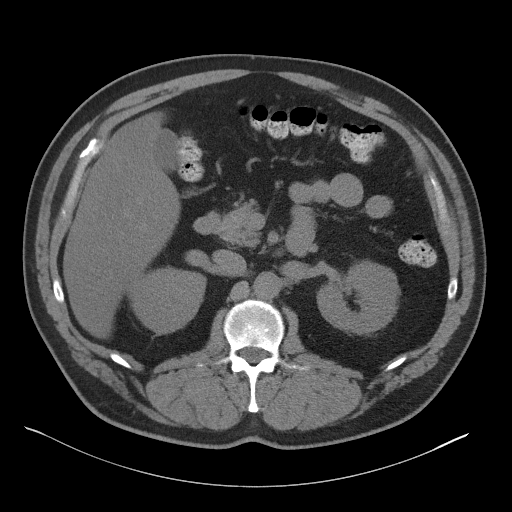
[im 82/104  soft-tissue]
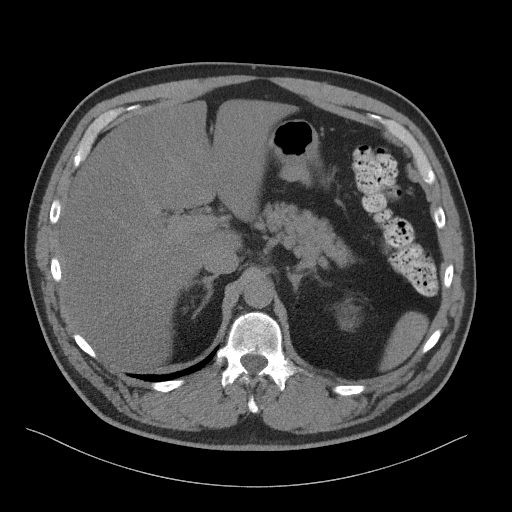
[im 91/104  soft-tissue]
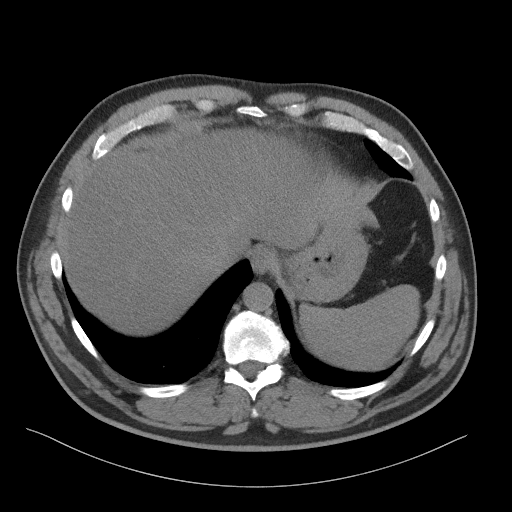
[im 99/104  soft-tissue]
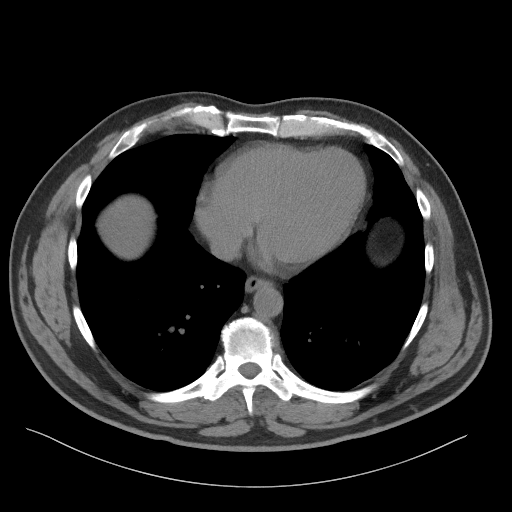

[Series 5: coronal st · coronal · 0.81mm/px · 3 of 106 slices shown]
[im 36/106  soft-tissue]
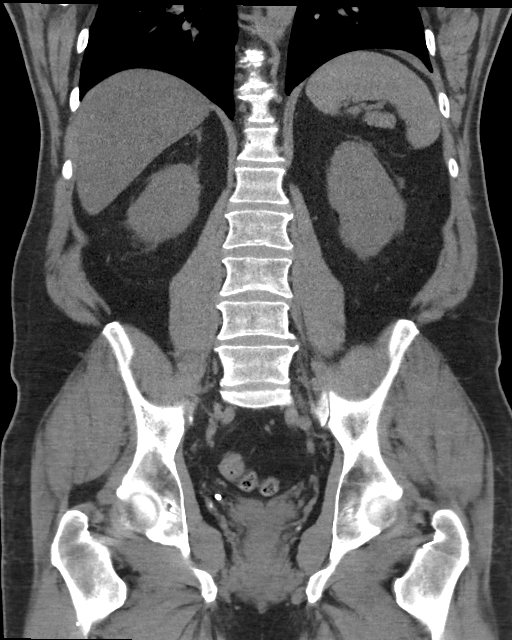
[im 47/106  soft-tissue]
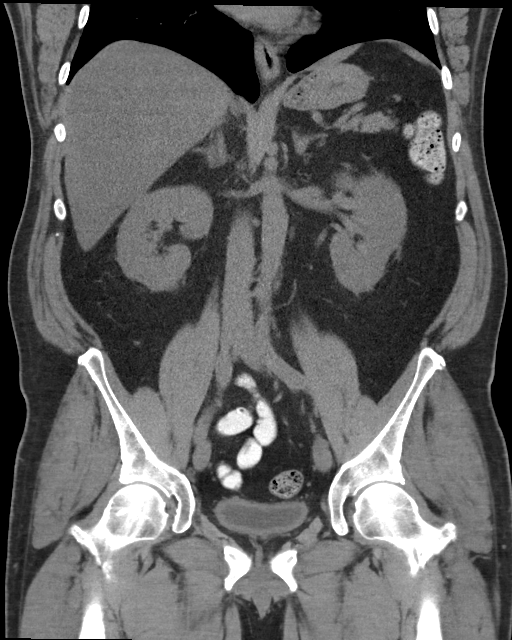
[im 59/106  soft-tissue]
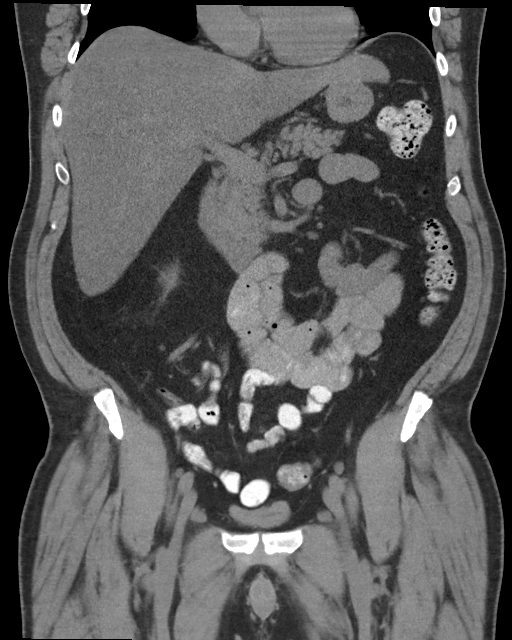

[16 of 46 positions shown; findings below may reference images not displayed]

FINDINGS: Lower chest: No acute findings.

Hepatobiliary: No mass visualized on this unenhanced exam.
Mild-to-moderate hepatic steatosis noted. Gallbladder is
unremarkable. No evidence of biliary ductal dilatation.

Pancreas: No mass or inflammatory process visualized on this
unenhanced exam.

Spleen:  Within normal limits in size.

Adrenals/Urinary tract: No evidence of urolithiasis or
hydronephrosis. Unremarkable unopacified urinary bladder.

Stomach/Bowel: No evidence of obstruction, inflammatory process, or
abnormal fluid collections. Normal appendix visualized.

Vascular/Lymphatic: No pathologically enlarged lymph nodes
identified. No evidence of abdominal aortic aneurysm.

Reproductive:  No mass or other significant abnormality.

Other:  None.

Musculoskeletal:  No suspicious bone lesions identified.
IMPRESSION: No evidence of urolithiasis, hydronephrosis, or other acute
findings.

Hepatic steatosis.

## 2023-01-12 ENCOUNTER — Encounter: Payer: Self-pay | Admitting: Family Medicine

## 2023-01-12 DIAGNOSIS — R040 Epistaxis: Secondary | ICD-10-CM

## 2023-01-13 NOTE — Telephone Encounter (Signed)
Referral entered.   CM

## 2023-02-04 ENCOUNTER — Ambulatory Visit (INDEPENDENT_AMBULATORY_CARE_PROVIDER_SITE_OTHER): Payer: Commercial Managed Care - PPO | Admitting: Otolaryngology

## 2023-02-04 ENCOUNTER — Encounter (INDEPENDENT_AMBULATORY_CARE_PROVIDER_SITE_OTHER): Payer: Self-pay

## 2023-02-04 VITALS — Ht 72.0 in | Wt 240.0 lb

## 2023-02-04 DIAGNOSIS — R040 Epistaxis: Secondary | ICD-10-CM

## 2023-02-07 DIAGNOSIS — R040 Epistaxis: Secondary | ICD-10-CM | POA: Insufficient documentation

## 2023-02-07 NOTE — Progress Notes (Signed)
Patient ID: Leon Henson, male   DOB: 1971/04/10, 52 y.o.   MRN: [id number]  CC: Recurrent epistaxis  HPI: Leon Henson is an 52 y.o. male who presents today for evaluation of his recurrent epistaxis.  According to the patient, he has been symptomatic for most of his life.  However, over the past 2 years, he has noted increase in frequency and severity of his left epistaxis.  Over the past month, he has noted near daily left-sided bleeding.  He denies the use of any blood thinner.  He has no previous ENT surgery.  He also denies any facial or nasal trauma.  Past Medical History:  Diagnosis Date   Anxiety    Gout    Hypertension    Hyperuricemia 10/23/2018   Prediabetes 03/07/2018    Past Surgical History:  Procedure Laterality Date   KNEE ARTHROCENTESIS Left     Family History  Problem Relation Age of Onset   Heart failure Mother    Heart attack Mother    Hypertension Father     Social History:  reports that he has never smoked. He has never used smokeless tobacco. He reports current alcohol use of about 24.0 standard drinks of alcohol per week. He reports that he does not use drugs.  Allergies:  Allergies  Allergen Reactions   Hydrochlorothiazide Other (See Comments)    ED, muscle cramps, gout   Lisinopril Cough    Prior to Admission medications   Medication Sig Start Date End Date Taking? Authorizing Provider  ALPRAZolam Prudy Feeler) 0.5 MG tablet Take 1 tablet (0.5 mg total) by mouth once as needed for up to 1 dose for anxiety. 12/15/22  Yes Everrett Coombe, DO  amLODipine-valsartan (EXFORGE) 10-160 MG tablet TAKE 1 TABLET BY MOUTH EVERY DAY 11/23/22  Yes Everrett Coombe, DO  atorvastatin (LIPITOR) 20 MG tablet Take 1 tablet (20 mg total) by mouth daily. 11/05/21  Yes Everrett Coombe, DO  citalopram (CELEXA) 10 MG tablet TAKE 1 TABLET BY MOUTH EVERY DAY 11/23/22  Yes Everrett Coombe, DO  colchicine 0.6 MG tablet TAKE 1 TABLET 1-2 TIMES A DAY FOR GOUT PREVENTION AND TREATMENT 09/29/22  Yes  Everrett Coombe, DO  Febuxostat 80 MG TABS TAKE 1 TABLET BY MOUTH EVERY DAY 11/23/22  Yes Everrett Coombe, DO   Height 6' (1.829 m), weight 108.9 kg. Exam: General: Communicates without difficulty, well nourished, no acute distress. Head: Normocephalic, no evidence injury, no tenderness, facial buttresses intact without stepoff. Face/sinus: No tenderness to palpation and percussion. Facial movement is normal and symmetric. Eyes: PERRL, EOMI. No scleral icterus, conjunctivae clear. Neuro: CN II exam reveals vision grossly intact.  No nystagmus at any point of gaze. Ears: Auricles well formed without lesions.  Ear canals are intact without mass or lesion.  No erythema or edema is appreciated.  The TMs are intact without fluid. Nose: External evaluation reveals normal support and skin without lesions.  Dorsum is intact.  Anterior rhinoscopy reveals hypervascular areas on the left nasal septum.  Oral:  Oral cavity and oropharynx are intact, symmetric, without erythema or edema.  Mucosa is moist without lesions. Neck: Full range of motion without pain.  There is no significant lymphadenopathy.  No masses palpable.  Thyroid bed within normal limits to palpation.  Parotid glands and submandibular glands equal bilaterally without mass.  Trachea is midline. Neuro:  CN 2-12 grossly intact.   Procedure:  Endoscopic control of recurrent left epistaxis. Indication:  Recurrent epistaxis  Description:  Theleft nasal cavity is sprayed  with topical xylocaine and neo-synephrine.  After adequate anesthesia is achieved, the nasal cavity is examined with a 0 rigid endoscope.  A suction catheter is inserted into parallel with the 0 endoscope, and it is used to suction blood clots from the nasal cavity.  Several hypervascular areas are noted on the anterior and superior portion of the septum. Active bleeding is noted. A silver nitrate stick is inserted in parallel with the 0 endoscope.  It is used to repeatedly cauterized the  hypervascular areas.  Good hemostasis is achieved.  The patient tolerated the procedure well.     Assessment: 1.  Recurrent left epistaxis. 2.  Several hypervascular areas are noted on the left anterior and superior nasal septum. 3.  No suspicious mass or lesion is noted today.  Plan: 1.  The physical exam and nasal endoscopy findings are reviewed with the patient. 2.  Endoscopic cauterization of the left nasal septum. 3.  Humidifier/nasal ointment as needed. 4.  The patient will return for reevaluation in 4 weeks.  Garry Bochicchio W Couper Juncaj 02/07/2023, 8:01 PM

## 2023-03-03 ENCOUNTER — Telehealth (INDEPENDENT_AMBULATORY_CARE_PROVIDER_SITE_OTHER): Payer: Self-pay | Admitting: Otolaryngology

## 2023-03-03 NOTE — Telephone Encounter (Signed)
Left vm regarding appt time and location

## 2023-03-04 ENCOUNTER — Ambulatory Visit (INDEPENDENT_AMBULATORY_CARE_PROVIDER_SITE_OTHER): Payer: Commercial Managed Care - PPO | Admitting: Otolaryngology

## 2023-03-04 ENCOUNTER — Encounter (INDEPENDENT_AMBULATORY_CARE_PROVIDER_SITE_OTHER): Payer: Self-pay

## 2023-03-04 VITALS — Ht 72.0 in | Wt 240.0 lb

## 2023-03-04 DIAGNOSIS — R040 Epistaxis: Secondary | ICD-10-CM

## 2023-03-04 NOTE — Progress Notes (Signed)
Patient ID: Leon Henson, male   DOB: 1970/09/04, 52 y.o.   MRN: [id number]  Follow-up: Recurrent epistaxis  HPI: The patient is a 52 year old male who returns today for his follow-up evaluation.  He was last seen 1 month ago.  At that time, he was noted to have hypervascular areas on the left nasal septum.  He was treated with endoscopic cauterization of his left nasal septum.  The patient returns today reporting significant improvement in his epistaxis.  He has not noted any significant bleeding.  He has occasional blood-tinged mucus.  He is able to breathe through both nostrils.  He denies any recent nasal trauma.  Exam: General: Communicates without difficulty, well nourished, no acute distress. Head: Normocephalic, no evidence injury, no tenderness, facial buttresses intact without stepoff. Face/sinus: No tenderness to palpation and percussion. Facial movement is normal and symmetric. Eyes: PERRL, EOMI. No scleral icterus, conjunctivae clear. Neuro: CN II exam reveals vision grossly intact.  No nystagmus at any point of gaze. Ears: Auricles well formed without lesions.  Ear canals are intact without mass or lesion.  No erythema or edema is appreciated.  The TMs are intact without fluid. Nose: External evaluation reveals normal support and skin without lesions.  Dorsum is intact.  Anterior rhinoscopy reveals congested mucosa over anterior aspect of inferior turbinates and intact septum.  No purulence noted. Oral:  Oral cavity and oropharynx are intact, symmetric, without erythema or edema.  Mucosa is moist without lesions. Neck: Full range of motion without pain.  There is no significant lymphadenopathy.  No masses palpable.  Thyroid bed within normal limits to palpation.  Parotid glands and submandibular glands equal bilaterally without mass.  Trachea is midline. Neuro:  CN 2-12 grossly intact.   Assessment: 1.  The patient's left epistaxis is currently under control.  The cauterized area is  well-healed. 2.  No significant hypervascular area is noted today.  Plan: 1.  The physical exam findings are reviewed with the patient. 2.  The patient is encouraged to use a humidifier/nasal ointments as needed during the winter months. 3.  The patient is encouraged to call with any questions or concerns.

## 2023-12-02 ENCOUNTER — Other Ambulatory Visit: Payer: Self-pay | Admitting: Family Medicine

## 2023-12-02 DIAGNOSIS — I10 Essential (primary) hypertension: Secondary | ICD-10-CM

## 2023-12-02 DIAGNOSIS — M1A9XX1 Chronic gout, unspecified, with tophus (tophi): Secondary | ICD-10-CM

## 2023-12-02 NOTE — Telephone Encounter (Signed)
 Pls contact pt to schedule with Dr. Alvia for HTN and Mood. Past due 6 months. Sending 30 day med refills. Thx

## 2023-12-13 ENCOUNTER — Encounter: Payer: Self-pay | Admitting: Sports Medicine

## 2023-12-26 ENCOUNTER — Other Ambulatory Visit: Payer: Self-pay | Admitting: Family Medicine

## 2023-12-26 DIAGNOSIS — M1A9XX1 Chronic gout, unspecified, with tophus (tophi): Secondary | ICD-10-CM

## 2023-12-26 DIAGNOSIS — I10 Essential (primary) hypertension: Secondary | ICD-10-CM

## 2024-01-13 ENCOUNTER — Other Ambulatory Visit: Payer: Self-pay | Admitting: Family Medicine

## 2024-01-13 DIAGNOSIS — M1A9XX1 Chronic gout, unspecified, with tophus (tophi): Secondary | ICD-10-CM

## 2024-01-13 NOTE — Telephone Encounter (Signed)
 Please contact pt to schedule HTN and elevated glucose appt with Dr. Alvia. Sending 15 day med refill. No additional refills. Thx

## 2024-01-22 ENCOUNTER — Other Ambulatory Visit: Payer: Self-pay | Admitting: Family Medicine

## 2024-01-22 DIAGNOSIS — M1A9XX1 Chronic gout, unspecified, with tophus (tophi): Secondary | ICD-10-CM

## 2024-01-30 ENCOUNTER — Other Ambulatory Visit: Payer: Self-pay | Admitting: Family Medicine

## 2024-01-30 DIAGNOSIS — M1A9XX1 Chronic gout, unspecified, with tophus (tophi): Secondary | ICD-10-CM

## 2024-02-10 ENCOUNTER — Ambulatory Visit: Admitting: Family Medicine

## 2024-02-15 ENCOUNTER — Ambulatory Visit: Admitting: Family Medicine

## 2024-02-15 ENCOUNTER — Encounter: Payer: Self-pay | Admitting: Family Medicine

## 2024-02-15 VITALS — BP 135/77 | HR 80 | Ht 72.0 in | Wt 254.0 lb

## 2024-02-15 DIAGNOSIS — I10 Essential (primary) hypertension: Secondary | ICD-10-CM

## 2024-02-15 DIAGNOSIS — E782 Mixed hyperlipidemia: Secondary | ICD-10-CM

## 2024-02-15 DIAGNOSIS — E790 Hyperuricemia without signs of inflammatory arthritis and tophaceous disease: Secondary | ICD-10-CM

## 2024-02-15 DIAGNOSIS — F411 Generalized anxiety disorder: Secondary | ICD-10-CM

## 2024-02-15 DIAGNOSIS — R7303 Prediabetes: Secondary | ICD-10-CM

## 2024-02-15 DIAGNOSIS — Z125 Encounter for screening for malignant neoplasm of prostate: Secondary | ICD-10-CM

## 2024-02-15 MED ORDER — CITALOPRAM HYDROBROMIDE 10 MG PO TABS: 10.0000 mg | ORAL_TABLET | Freq: Every day | ORAL | 1 refills | Status: AC

## 2024-02-15 MED ORDER — COLCHICINE 0.6 MG PO TABS: ORAL_TABLET | ORAL | 1 refills | Status: AC

## 2024-02-15 MED ORDER — FEBUXOSTAT 80 MG PO TABS: 1.0000 | ORAL_TABLET | Freq: Every day | ORAL | 1 refills | Status: AC

## 2024-02-15 MED ORDER — AMLODIPINE BESYLATE-VALSARTAN 10-160 MG PO TABS: 1.0000 | ORAL_TABLET | Freq: Every day | ORAL | 1 refills | Status: AC

## 2024-02-15 NOTE — Assessment & Plan Note (Signed)
 BP remains well controlled continue Exforge  at current strength.

## 2024-02-15 NOTE — Assessment & Plan Note (Signed)
 Anxiety is well controlled with citalopram  daily.  Rare use of alprazolam .

## 2024-02-15 NOTE — Progress Notes (Signed)
 Leon Henson - 53 y.o. male MRN [id number]  Date of birth: 1970-07-27  Subjective Chief Complaint  Patient presents with   Follow-up    HTN / PREDIABETES     HPI Leon Henson is a 53 y.o. here today for follow up visit.   He reports that he is doing well  He continues on amlodipine /valsartan  for management of HTN.  BP is well controlled on initial check today.  Denies chest pain, shortness of breath, palpitations, headache or vision changes.   History of gout.  Managed with uloric  with colchicine  prn for flares.  This is working well for him.   No recent gout flares.   Mood is stable with citalopram  at current strength.  No side effects from this at this time.   ROS:  A comprehensive ROS was completed and negative except as noted per HPI  Allergies  Allergen Reactions   Hydrochlorothiazide  Other (See Comments)    ED, muscle cramps, gout   Lisinopril  Cough    Past Medical History:  Diagnosis Date   Anxiety    Gout    Hypertension    Hyperuricemia 10/23/2018   Prediabetes 03/07/2018    Past Surgical History:  Procedure Laterality Date   KNEE ARTHROCENTESIS Left     Social History   Socioeconomic History   Marital status: Single    Spouse name: Not on file   Number of children: Not on file   Years of education: Not on file   Highest education level: Bachelor's degree (e.g., BA, AB, BS)  Occupational History   Not on file  Tobacco Use   Smoking status: Never   Smokeless tobacco: Never  Vaping Use   Vaping status: Never Used  Substance and Sexual Activity   Alcohol use: Yes    Alcohol/week: 24.0 standard drinks of alcohol    Types: 24 Cans of beer per week   Drug use: No   Sexual activity: Yes  Other Topics Concern   Not on file  Social History Narrative   Not on file   Social Drivers of Health   Financial Resource Strain: Low Risk  (02/06/2024)   Overall Financial Resource Strain (CARDIA)    Difficulty of Paying Living Expenses: Not hard at all  Food  Insecurity: No Food Insecurity (02/06/2024)   Hunger Vital Sign    Worried About Running Out of Food in the Last Year: Never true    Ran Out of Food in the Last Year: Never true  Transportation Needs: No Transportation Needs (02/06/2024)   PRAPARE - Administrator, Civil Service (Medical): No    Lack of Transportation (Non-Medical): No  Physical Activity: Sufficiently Active (02/06/2024)   Exercise Vital Sign    Days of Exercise per Week: 4 days    Minutes of Exercise per Session: 100 min  Stress: No Stress Concern Present (02/06/2024)   [employer] of Occupational Health - Occupational Stress Questionnaire    Feeling of Stress: Only a little  Social Connections: Socially Isolated (02/06/2024)   Social Connection and Isolation Panel    Frequency of Communication with Friends and Family: More than three times a week    Frequency of Social Gatherings with Friends and Family: Three times a week    Attends Religious Services: Patient declined    Active Member of Clubs or Organizations: No    Attends Engineer, Structural: Not on file    Marital Status: Divorced    Family History  Problem Relation Age  of Onset   Heart failure Mother    Heart attack Mother    Hypertension Father     Health Maintenance  Topic Date Due   HIV Screening  Never done   Hepatitis C Screening  Never done   DTaP/Tdap/Td (1 - Tdap) Never done   Hepatitis B Vaccines 19-59 Average Risk (1 of 3 - 19+ 3-dose series) Never done   Zoster Vaccines- Shingrix (1 of 2) Never done   Colonoscopy  Never done   Pneumococcal Vaccine: 50+ Years (1 of 1 - PCV) Never done   HPV VACCINES  Aged Out   Meningococcal B Vaccine  Aged Out   Influenza Vaccine  Discontinued   COVID-19 Vaccine  Discontinued      ----------------------------------------------------------------------------------------------------------------------------------------------------------------------------------------------------------------- Physical Exam BP 135/77 (BP Location: Left Arm, Patient Position: Sitting, Cuff Size: Normal)   Pulse 80   Ht 6' (1.829 m)   Wt 254 lb (115.2 kg)   SpO2 98%   BMI 34.45 kg/m   Physical Exam Constitutional:      General: He is not in acute distress. HENT:     Head: Normocephalic and atraumatic.     Right Ear: Tympanic membrane and external ear normal.     Left Ear: Tympanic membrane and external ear normal.  Eyes:     General: No scleral icterus. Neck:     Thyroid : No thyromegaly.  Cardiovascular:     Rate and Rhythm: Normal rate and regular rhythm.     Heart sounds: Normal heart sounds.  Pulmonary:     Effort: Pulmonary effort is normal.     Breath sounds: Normal breath sounds.  Abdominal:     General: Bowel sounds are normal. There is no distension.     Palpations: Abdomen is soft.     Tenderness: There is no abdominal tenderness. There is no guarding.  Musculoskeletal:     Cervical back: Normal range of motion.  Lymphadenopathy:     Cervical: No cervical adenopathy.  Skin:    General: Skin is warm and dry.     Findings: No rash.  Neurological:     Mental Status: He is alert and oriented to person, place, and time.     Cranial Nerves: No cranial nerve deficit.     Motor: No abnormal muscle tone.  Psychiatric:        Mood and Affect: Mood normal.        Behavior: Behavior normal.     ------------------------------------------------------------------------------------------------------------------------------------------------------------------------------------------------------------------- Assessment and Plan  Hypertension goal BP (blood pressure) < 130/80 BP remains well controlled continue Exforge  at current strength.   Mixed  hyperlipidemia Tolerating atorvastatin  well.  Updated lipid and lft's ordered.    Prediabetes Update A1c.  Hyperuricemia No recent gout flares.  Continue uloric  at current strength with colchicine  as needed.    GAD (generalized anxiety disorder) Anxiety is well controlled with citalopram  daily.  Rare use of alprazolam .    Meds ordered this encounter  Medications   Febuxostat  80 MG TABS    Sig: Take 1 tablet (80 mg total) by mouth daily.    Dispense:  90 tablet    Refill:  1   amLODipine -valsartan  (EXFORGE ) 10-160 MG tablet    Sig: Take 1 tablet by mouth daily.    Dispense:  90 tablet    Refill:  1   citalopram  (CELEXA ) 10 MG tablet    Sig: Take 1 tablet (10 mg total) by mouth daily.    Dispense:  90 tablet  Refill:  1   colchicine  0.6 MG tablet    Sig: TAKE 1 TABLET 1-2 TIMES A DAY FOR GOUT PREVENTION AND TREATMENT    Dispense:  90 tablet    Refill:  1    Return in about 6 months (around 08/14/2024) for Hypertension.

## 2024-02-15 NOTE — Assessment & Plan Note (Signed)
 Update A1c ?

## 2024-02-15 NOTE — Assessment & Plan Note (Signed)
No recent gout flares.  Continue uloric at current strength with colchicine as needed.

## 2024-02-15 NOTE — Assessment & Plan Note (Signed)
Tolerating atorvastatin well.  Updated lipid and lft's ordered.

## 2024-02-21 LAB — CMP14+EGFR
ALT: 30 IU/L (ref 0–44)
AST: 23 IU/L (ref 0–40)
Albumin: 4.5 g/dL (ref 3.8–4.9)
Alkaline Phosphatase: 127 IU/L — ABNORMAL HIGH (ref 47–123)
BUN/Creatinine Ratio: 10 (ref 9–20)
BUN: 9 mg/dL (ref 6–24)
Bilirubin Total: 0.6 mg/dL (ref 0.0–1.2)
CO2: 22 mmol/L (ref 20–29)
Calcium: 9.6 mg/dL (ref 8.7–10.2)
Chloride: 96 mmol/L (ref 96–106)
Creatinine, Ser: 0.9 mg/dL (ref 0.76–1.27)
Globulin, Total: 2.9 g/dL (ref 1.5–4.5)
Glucose: 225 mg/dL — ABNORMAL HIGH (ref 70–99)
Potassium: 4.3 mmol/L (ref 3.5–5.2)
Sodium: 138 mmol/L (ref 134–144)
Total Protein: 7.4 g/dL (ref 6.0–8.5)
eGFR: 101 mL/min/1.73 (ref >59)

## 2024-02-21 LAB — CBC WITH DIFFERENTIAL/PLATELET
Basophils Absolute: 0.1 x10E3/uL (ref 0.0–0.2)
Basos: 1 %
EOS (ABSOLUTE): 0.3 x10E3/uL (ref 0.0–0.4)
Eos: 3 %
Hematocrit: 50.2 % (ref 37.5–51.0)
Hemoglobin: 17.3 g/dL (ref 13.0–17.7)
Immature Grans (Abs): 0.1 x10E3/uL (ref 0.0–0.1)
Immature Granulocytes: 1 %
Lymphocytes Absolute: 3.1 x10E3/uL (ref 0.7–3.1)
Lymphs: 30 %
MCH: 32.3 pg (ref 26.6–33.0)
MCHC: 34.5 g/dL (ref 31.5–35.7)
MCV: 94 fL (ref 79–97)
Monocytes Absolute: 1 x10E3/uL — ABNORMAL HIGH (ref 0.1–0.9)
Monocytes: 9 %
Neutrophils Absolute: 5.9 x10E3/uL (ref 1.4–7.0)
Neutrophils: 56 %
Platelets: 277 x10E3/uL (ref 150–450)
RBC: 5.35 x10E6/uL (ref 4.14–5.80)
RDW: 12.4 % (ref 11.6–15.4)
WBC: 10.5 x10E3/uL (ref 3.4–10.8)

## 2024-02-21 LAB — HEMOGLOBIN A1C
Est. average glucose Bld gHb Est-mCnc: 200 mg/dL
Hgb A1c MFr Bld: 8.6 % — ABNORMAL HIGH (ref 4.8–5.6)

## 2024-02-21 LAB — LIPID PANEL WITH LDL/HDL RATIO
Cholesterol, Total: 278 mg/dL — ABNORMAL HIGH (ref 100–199)
HDL: 36 mg/dL — ABNORMAL LOW (ref >39)
LDL Chol Calc (NIH): 131 mg/dL — ABNORMAL HIGH (ref 0–99)
LDL/HDL Ratio: 3.6 ratio (ref 0.0–3.6)
Triglycerides: 599 mg/dL (ref 0–149)
VLDL Cholesterol Cal: 111 mg/dL — ABNORMAL HIGH (ref 5–40)

## 2024-02-21 LAB — URIC ACID: Uric Acid: 2.9 mg/dL — ABNORMAL LOW (ref 3.8–8.4)

## 2024-02-21 LAB — PSA: Prostate Specific Ag, Serum: 0.5 ng/mL (ref 0.0–4.0)

## 2024-02-22 ENCOUNTER — Encounter: Payer: Self-pay | Admitting: Family Medicine

## 2024-02-29 ENCOUNTER — Other Ambulatory Visit: Payer: Self-pay | Admitting: Family Medicine

## 2024-02-29 MED ORDER — ROSUVASTATIN CALCIUM 20 MG PO TABS: 20.0000 mg | ORAL_TABLET | Freq: Every day | ORAL | 1 refills | Status: AC

## 2024-02-29 MED ORDER — TIRZEPATIDE 5 MG/0.5ML ~~LOC~~ SOAJ: 5.0000 mg | SUBCUTANEOUS | 0 refills | Status: DC

## 2024-02-29 MED ORDER — TIRZEPATIDE 2.5 MG/0.5ML ~~LOC~~ SOAJ: 2.5000 mg | SUBCUTANEOUS | 0 refills | Status: DC

## 2024-03-13 ENCOUNTER — Encounter: Payer: Self-pay | Admitting: Family Medicine

## 2024-03-14 NOTE — Telephone Encounter (Signed)
 Would you please check to see if a Mounjaro  prior authorization has been done for this patient ?

## 2024-03-15 ENCOUNTER — Other Ambulatory Visit (HOSPITAL_COMMUNITY): Payer: Self-pay

## 2024-03-15 ENCOUNTER — Encounter: Payer: Self-pay | Admitting: Family Medicine

## 2024-03-15 DIAGNOSIS — E119 Type 2 diabetes mellitus without complications: Secondary | ICD-10-CM | POA: Insufficient documentation

## 2024-03-16 ENCOUNTER — Other Ambulatory Visit (HOSPITAL_COMMUNITY): Payer: Self-pay

## 2024-03-16 ENCOUNTER — Telehealth: Payer: Self-pay

## 2024-03-16 NOTE — Telephone Encounter (Signed)
 Pharmacy Patient Advocate Encounter   Received notification from Patient Advice Request messages that prior authorization for Mounjaro  2.5mg /0.46ml is required/requested.   Insurance verification completed.   The patient is insured through [facility].   Per test claim: PA required; PA submitted to above mentioned insurance via Latent Key/confirmation #/EOC [facility] Status is pending

## 2024-03-16 NOTE — Telephone Encounter (Signed)
 Pharmacy Patient Advocate Encounter  Received notification from [facility] that Prior Authorization for Mounjaro  2.5mg /0.76ml has been APPROVED from 03/16/24 to 03/16/27. Ran test claim, Copay is $35. This test claim was processed through [facility]- copay amounts may vary at other pharmacies due to pharmacy/plan contracts, or as the patient moves through the different stages of their insurance plan.   PA #/Case ID/Reference #: [id number]  Left a message at [facility] to notify of the approval

## 2024-03-19 ENCOUNTER — Other Ambulatory Visit (HOSPITAL_COMMUNITY): Payer: Self-pay

## 2024-05-16 ENCOUNTER — Emergency Department (HOSPITAL_BASED_OUTPATIENT_CLINIC_OR_DEPARTMENT_OTHER)

## 2024-05-16 ENCOUNTER — Other Ambulatory Visit: Payer: Self-pay

## 2024-05-16 ENCOUNTER — Encounter (HOSPITAL_BASED_OUTPATIENT_CLINIC_OR_DEPARTMENT_OTHER): Payer: Self-pay | Admitting: Emergency Medicine

## 2024-05-16 ENCOUNTER — Emergency Department (HOSPITAL_BASED_OUTPATIENT_CLINIC_OR_DEPARTMENT_OTHER)
Admission: EM | Admit: 2024-05-16 | Discharge: 2024-05-16 | Disposition: A | Discharge status: Home / Self Care | Attending: Emergency Medicine | Admitting: Emergency Medicine

## 2024-05-16 ENCOUNTER — Ambulatory Visit
Admission: EM | Admit: 2024-05-16 | Discharge: 2024-05-16 | Disposition: A | Discharge status: Home / Self Care | Source: Home / Self Care | Attending: Family Medicine | Admitting: Family Medicine

## 2024-05-16 DIAGNOSIS — R739 Hyperglycemia, unspecified: Secondary | ICD-10-CM | POA: Diagnosis not present

## 2024-05-16 DIAGNOSIS — R531 Weakness: Secondary | ICD-10-CM

## 2024-05-16 DIAGNOSIS — R42 Dizziness and giddiness: Secondary | ICD-10-CM

## 2024-05-16 DIAGNOSIS — Z7984 Long term (current) use of oral hypoglycemic drugs: Secondary | ICD-10-CM | POA: Insufficient documentation

## 2024-05-16 LAB — COMPREHENSIVE METABOLIC PANEL WITH GFR
ALT: 54 U/L — ABNORMAL HIGH (ref 0–44)
AST: 52 U/L — ABNORMAL HIGH (ref 15–41)
Albumin: 4.6 g/dL (ref 3.5–5.0)
Alkaline Phosphatase: 106 U/L (ref 38–126)
Anion gap: 15 (ref 5–15)
BUN: 22 mg/dL — ABNORMAL HIGH (ref 6–20)
CO2: 21 mmol/L — ABNORMAL LOW (ref 22–32)
Calcium: 9.4 mg/dL (ref 8.9–10.3)
Chloride: 99 mmol/L (ref 98–111)
Creatinine, Ser: 0.89 mg/dL (ref 0.61–1.24)
GFR, Estimated: >60 mL/min
Glucose, Bld: 347 mg/dL — ABNORMAL HIGH (ref 70–99)
Potassium: 4.2 mmol/L (ref 3.5–5.1)
Sodium: 135 mmol/L (ref 135–145)
Total Bilirubin: 0.6 mg/dL (ref 0.0–1.2)
Total Protein: 7.9 g/dL (ref 6.5–8.1)

## 2024-05-16 LAB — CBC
HCT: 44.3 % (ref 39.0–52.0)
Hemoglobin: 15.9 g/dL (ref 13.0–17.0)
MCH: 31.8 pg (ref 26.0–34.0)
MCHC: 35.9 g/dL (ref 30.0–36.0)
MCV: 88.6 fL (ref 80.0–100.0)
Platelets: 224 10*3/uL (ref 150–400)
RBC: 5 MIL/uL (ref 4.22–5.81)
RDW: 11.5 % (ref 11.5–15.5)
WBC: 6.9 10*3/uL (ref 4.0–10.5)
nRBC: 0 % (ref 0.0–0.2)

## 2024-05-16 LAB — I-STAT VENOUS BLOOD GAS, ED
Acid-base deficit: 2 mmol/L (ref 0.0–2.0)
Bicarbonate: 22.8 mmol/L (ref 20.0–28.0)
Calcium, Ion: 1.17 mmol/L (ref 1.15–1.40)
HCT: 46 % (ref 39.0–52.0)
Hemoglobin: 15.6 g/dL (ref 13.0–17.0)
O2 Saturation: 80 %
Potassium: 4.1 mmol/L (ref 3.5–5.1)
Sodium: 135 mmol/L (ref 135–145)
TCO2: 24 mmol/L (ref 22–32)
pCO2, Ven: 36.9 mmHg — ABNORMAL LOW (ref 44–60)
pH, Ven: 7.4 (ref 7.25–7.43)
pO2, Ven: 44 mmHg (ref 32–45)

## 2024-05-16 LAB — DIFFERENTIAL
Abs Immature Granulocytes: 0.02 10*3/uL (ref 0.00–0.07)
Basophils Absolute: 0.1 10*3/uL (ref 0.0–0.1)
Basophils Relative: 1 %
Eosinophils Absolute: 0.2 10*3/uL (ref 0.0–0.5)
Eosinophils Relative: 3 %
Immature Granulocytes: 0 %
Lymphocytes Relative: 21 %
Lymphs Abs: 1.5 10*3/uL (ref 0.7–4.0)
Monocytes Absolute: 0.7 10*3/uL (ref 0.1–1.0)
Monocytes Relative: 10 %
Neutro Abs: 4.5 10*3/uL (ref 1.7–7.7)
Neutrophils Relative %: 65 %

## 2024-05-16 LAB — CBG MONITORING, ED
Glucose-Capillary: 339 mg/dL — ABNORMAL HIGH (ref 70–99)
Glucose-Capillary: 257 mg/dL — ABNORMAL HIGH (ref 70–99)

## 2024-05-16 LAB — LIPASE, BLOOD: Lipase: 33 U/L (ref 11–51)

## 2024-05-16 LAB — GLUCOSE, POCT (MANUAL RESULT ENTRY): POC Glucose: 424 mg/dL — AB (ref 70–99)

## 2024-05-16 MED ORDER — INSULIN ASPART 100 UNIT/ML IJ SOLN
5.0000 [IU] | Freq: Once | INTRAMUSCULAR | Status: AC
  Administered 2024-05-16: 5 [IU] via SUBCUTANEOUS
  Filled 2024-05-16: qty 5

## 2024-05-16 MED ORDER — SODIUM CHLORIDE 0.9 % IV BOLUS
1000.0000 mL | Freq: Once | INTRAVENOUS | Status: AC
  Administered 2024-05-16: 1000 mL via INTRAVENOUS

## 2024-05-16 MED ORDER — METFORMIN HCL 500 MG PO TABS: 500.0000 mg | ORAL_TABLET | Freq: Two times a day (BID) | ORAL | 0 refills | Status: AC

## 2024-05-16 NOTE — Discharge Instructions (Addendum)
 Advised patient to go to [facility] ED now for further evaluation of hyperglycemia, dizziness, weakness and tingling of arms and legs.

## 2024-05-16 NOTE — ED Notes (Signed)
 Patient is being discharged from the Urgent Care and sent to the Emergency Department via POV driven by friend. Per Ozell Major, FNP, patient is in need of higher level of care due to dizziness and hyperglycemia. Patient is aware and verbalizes understanding of plan of care.  Vitals:   05/16/24 1444  BP: (!) 165/96  Pulse: 94  Resp: 15  Temp: 98.4 F (36.9 C)  SpO2: 94%

## 2024-05-16 NOTE — Discharge Instructions (Signed)
 Follow-up with your primary care doctor for further blood sugar management return if symptoms worsen

## 2024-05-16 NOTE — ED Triage Notes (Signed)
 Hyperglycemia today at [facility] , was high on his last PCP visit , was prescribed Mounjaro   , yet did not take it due to its side effects ,   Reports tingling to bilateral hand and feet ,   Feeling weak and fatigued ,  Ambulatory , alert and oriented x 4 .   Denies NV or abdominal pain .

## 2024-05-16 NOTE — ED Triage Notes (Signed)
 Pt stated he was at work ate his lunch then started to feel dizziness. Pt stated he was feeling weak, and felt some tingling in both arms and legs. Pt also states he's having pain in the lower back and was giving aspirin by the company nurse. Pt took a Xanax  prior to arrival at 1:15pm.
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
## 2024-05-16 NOTE — ED Provider Notes (Signed)
 " [place] [facility] Provider Note   CSN: [id number] Arrival date & time: 05/16/24  1541     Patient presents with: Hyperglycemia   Leon Henson is a 54 y.o. male.   Patient here with some generalized weakness, elevated blood sugar.  Denies any weakness headache vision loss.  He has been prescribed Mounjaro  but has not taken it due to concern for side effects.  He was scribed this several months ago.  He is at chronically elevated blood sugar for months now.  He has had some intermittent tingling's in both hands and feet but nothing persistent.  He denies any chest pain shortness of breath.  Denies any speech changes.  No headaches.  No chest pain.  The history is provided by the patient.       Prior to Admission medications  Medication Sig Start Date End Date Taking? Authorizing Provider  metFORMIN  (GLUCOPHAGE ) 500 MG tablet Take 1 tablet (500 mg total) by mouth 2 (two) times daily with a meal. 05/16/24 06/15/24 Yes Christin Moline, DO  ALPRAZolam  (XANAX ) 0.5 MG tablet Take 1 tablet (0.5 mg total) by mouth once as needed for up to 1 dose for anxiety. 12/15/22   Alvia Bring, DO  amLODipine -valsartan  (EXFORGE ) 10-160 MG tablet Take 1 tablet by mouth daily. 02/15/24   Alvia Bring, DO  citalopram  (CELEXA ) 10 MG tablet Take 1 tablet (10 mg total) by mouth daily. 02/15/24   Alvia Bring, DO  colchicine  0.6 MG tablet TAKE 1 TABLET 1-2 TIMES A DAY FOR GOUT PREVENTION AND TREATMENT 02/15/24   Alvia Bring, DO  Febuxostat  80 MG TABS Take 1 tablet (80 mg total) by mouth daily. 02/15/24   Alvia Bring, DO  rosuvastatin  (CRESTOR ) 20 MG tablet Take 1 tablet (20 mg total) by mouth daily. 02/29/24   Alvia Bring, DO  tirzepatide  (MOUNJARO ) 2.5 MG/0.5ML Pen Inject 2.5 mg into the skin once a week. 02/29/24   Alvia Bring, DO  tirzepatide  (MOUNJARO ) 5 MG/0.5ML Pen Inject 5 mg into the skin once a week. 02/29/24   Alvia Bring, DO    Allergies:  Hydrochlorothiazide  and Lisinopril     Review of Systems  Updated Vital Signs BP (!) 152/89 (BP Location: Right Arm)   Pulse 82   Temp 98.2 F (36.8 C)   Resp 16   Wt 109.3 kg   SpO2 98%   BMI 32.69 kg/m   Physical Exam Vitals and nursing note reviewed.  Constitutional:      General: He is not in acute distress.    Appearance: He is well-developed. He is not ill-appearing.  HENT:     Head: Normocephalic and atraumatic.     Nose: Nose normal.     Mouth/Throat:     Mouth: Mucous membranes are moist.  Eyes:     Extraocular Movements: Extraocular movements intact.     Conjunctiva/sclera: Conjunctivae normal.     Pupils: Pupils are equal, round, and reactive to light.  Cardiovascular:     Rate and Rhythm: Normal rate and regular rhythm.     Pulses: Normal pulses.     Heart sounds: Normal heart sounds. No murmur heard. Pulmonary:     Effort: Pulmonary effort is normal. No respiratory distress.     Breath sounds: Normal breath sounds.  Abdominal:     General: Abdomen is flat.     Palpations: Abdomen is soft.     Tenderness: There is no abdominal tenderness.  Musculoskeletal:  General: No swelling.     Cervical back: Normal range of motion and neck supple.  Skin:    General: Skin is warm and dry.     Capillary Refill: Capillary refill takes less than 2 seconds.  Neurological:     General: No focal deficit present.     Mental Status: He is alert and oriented to person, place, and time.     Cranial Nerves: No cranial nerve deficit.     Sensory: No sensory deficit.     Motor: No weakness.     Coordination: Coordination normal.     Comments: 5+ out of 5 strength, normal sensation, no drift, normal finger-nose-finger, normal speech  Psychiatric:        Mood and Affect: Mood normal.     (all labs ordered are listed, but only abnormal results are displayed) Labs Reviewed  COMPREHENSIVE METABOLIC PANEL WITH GFR - Abnormal; Notable for the following components:       Result Value   CO2 21 (*)    Glucose, Bld 347 (*)    BUN 22 (*)    AST 52 (*)    ALT 54 (*)    All other components within normal limits  I-STAT VENOUS BLOOD GAS, ED - Abnormal; Notable for the following components:   pCO2, Ven 36.9 (*)    All other components within normal limits  CBG MONITORING, ED - Abnormal; Notable for the following components:   Glucose-Capillary 339 (*)    All other components within normal limits  CBG MONITORING, ED - Abnormal; Notable for the following components:   Glucose-Capillary 257 (*)    All other components within normal limits  LIPASE, BLOOD  CBC  DIFFERENTIAL  CBC WITH DIFFERENTIAL/PLATELET    EKG: EKG Interpretation Date/Time:  Wednesday May 16 2024 16:26:26 EST Ventricular Rate:  91 PR Interval:  152 QRS Duration:  92 QT Interval:  374 QTC Calculation: 461 R Axis:   63  Text Interpretation: Sinus rhythm Confirmed by Ruthe Cornet [phone]) on 05/16/2024 5:55:53 PM  Radiology: CT Head Wo Contrast Result Date: 05/16/2024 EXAM: CT HEAD WITHOUT CONTRAST 05/16/2024 04:41:00 PM TECHNIQUE: CT of the head was performed without the administration of intravenous contrast. Automated exposure control, iterative reconstruction, and/or weight based adjustment of the mA/kV was utilized to reduce the radiation dose to as low as reasonably achievable. COMPARISON: None available. CLINICAL HISTORY: Headache, increasing frequency or severity. FINDINGS: BRAIN AND VENTRICLES: No acute hemorrhage. No evidence of acute infarct. No hydrocephalus. No extra-axial collection. No mass effect or midline shift. ORBITS: No acute abnormality. SINUSES: Bilateral maxillary sinus mucosal thickening and mucous retention cysts. Additional scattered mucosal thickening in the ethmoid sinuses. SOFT TISSUES AND SKULL: No acute soft tissue abnormality. No skull fracture. IMPRESSION: 1. No acute intracranial abnormality. Electronically signed by: Donnice Mania MD 05/16/2024 04:53 PM EST  RP Workstation: [id number]     Procedures   Medications Ordered in the ED  sodium chloride  0.9 % bolus 1,000 mL (0 mLs Intravenous Stopped 05/16/24 1951)  insulin  aspart (novoLOG ) injection 5 Units (5 Units Subcutaneous Given 05/16/24 1856)                                    Medical Decision Making Amount and/or Complexity of Data Reviewed Labs: ordered. Radiology: ordered.  Risk Prescription drug management.   Leon Henson is here with elevated blood sugar.  Unremarkable vitals.  No fever.  He supposed to be on Mounjaro  but has not started this due to concern for side effects.  He was prescribed this several months ago and has not talked to his primary care doctor about this.  Blood sugar elevated in the [facility].  Will evaluate for DKA.  He is having some neuropathy symptoms at times in both hands and feet but normal exam today.  Have no concern for stroke.  Head CT has been done that is unremarkable.  Basic labs do not show any evidence of DKA.  Blood sugar 347.  Lab work is otherwise unremarkable.  Will give IV fluids a dose of NovoLog  and recheck blood sugar.  I will start him on metformin  to help follow-up with his primary care doctor for further blood sugar management.  Blood sugars in proved.  Will start metformin .  He is already arrange follow-up with his primary care doctor.  Discharged in good condition.  This chart was dictated using voice recognition software.  Despite best efforts to proofread,  errors can occur which can change the documentation meaning.      Final diagnoses:  Hyperglycemia    ED Discharge Orders          Ordered    metFORMIN  (GLUCOPHAGE ) 500 MG tablet  2 times daily with meals        05/16/24 1828               Ruthe Cornet, DO 05/16/24 2007  "

## 2024-05-17 ENCOUNTER — Other Ambulatory Visit: Payer: Self-pay | Admitting: Family Medicine

## 2024-05-17 ENCOUNTER — Encounter: Payer: Self-pay | Admitting: Family Medicine

## 2024-05-17 ENCOUNTER — Ambulatory Visit: Admitting: Family Medicine

## 2024-05-17 VITALS — BP 145/74 | HR 86 | Ht 72.0 in | Wt 247.0 lb

## 2024-05-17 DIAGNOSIS — R7303 Prediabetes: Secondary | ICD-10-CM

## 2024-05-17 DIAGNOSIS — R7309 Other abnormal glucose: Secondary | ICD-10-CM

## 2024-05-17 DIAGNOSIS — E119 Type 2 diabetes mellitus without complications: Secondary | ICD-10-CM | POA: Diagnosis not present

## 2024-05-17 LAB — GLUCOSE, POCT (MANUAL RESULT ENTRY): POC Glucose: 383 mg/dL — AB (ref 70–99)

## 2024-05-17 MED ORDER — TOUJEO SOLOSTAR 300 UNIT/ML ~~LOC~~ SOPN: 15.0000 [IU] | PEN_INJECTOR | Freq: Every day | SUBCUTANEOUS | 0 refills | Status: DC

## 2024-05-17 MED ORDER — PEN NEEDLES 32G X 4 MM MISC: 0 refills | Status: AC

## 2024-05-17 NOTE — Patient Instructions (Addendum)
 Start mounjaro  2.5mg /week x4 weeks Use toujeo  15 units daily until blood sugars are starting to come down.  See me again in 6 weeks.

## 2024-05-17 NOTE — Assessment & Plan Note (Signed)
 He has had hyperglycemia recently.  Continue metformin  at current strength.  He is adding tirzepatide  at 2.5 mg weekly after discussion today.  Will also add an additional 15 units of Toujeo  as we titrate to his appetite.  Provided with samples of freestyle libre to closely monitor glucose.  Follow-up in 4 to 6 weeks.

## 2024-05-17 NOTE — Progress Notes (Signed)
 " Leon Henson - 54 y.o. male MRN [id number]  Date of birth: November 17, 1970  Subjective Chief Complaint  Patient presents with   Hyperglycemia    HPI Leon Henson is a 54 y.o. male here today for follow up visit.    Seen in the ED recently due to hyperglycemia.  Reports that he was at work experienced tingling sensation in his upper body.  Seen in the ED with initial blood sugars 339.  He was given some insulin  in the ED with improvement to 257.  He never started metformin  or Mounjaro  after last appointment.  Close provided with updated prescription for metformin  and has started this.  He does have Mounjaro  at home but was concerned about potential side effects with nausea so he never started this.  He is willing to start this at this point.  He has had some urinary frequency.   ROS:  A comprehensive ROS was completed and negative except as noted per HPI  Allergies[1]  Past Medical History:  Diagnosis Date   Anxiety    Gout    Hypertension    Hyperuricemia 10/23/2018   Prediabetes 03/07/2018    Past Surgical History:  Procedure Laterality Date   KNEE ARTHROCENTESIS Left     Social History   Socioeconomic History   Marital status: Divorced    Spouse name: Not on file   Number of children: Not on file   Years of education: Not on file   Highest education level: Bachelor's degree (e.g., BA, AB, BS)  Occupational History   Not on file  Tobacco Use   Smoking status: Never   Smokeless tobacco: Never  Vaping Use   Vaping status: Never Used  Substance and Sexual Activity   Alcohol use: Yes    Alcohol/week: 24.0 standard drinks of alcohol    Types: 24 Cans of beer per week   Drug use: No   Sexual activity: Yes  Other Topics Concern   Not on file  Social History Narrative   Not on file   Social Drivers of Health   Tobacco Use: Low Risk (05/17/2024)   Patient History    Smoking Tobacco Use: Never    Smokeless Tobacco Use: Never    Passive Exposure: Not on file  Financial  Resource Strain: Low Risk (02/06/2024)   Overall Financial Resource Strain (CARDIA)    Difficulty of Paying Living Expenses: Not hard at all  Food Insecurity: No Food Insecurity (02/06/2024)   Epic    Worried About Radiation Protection Practitioner of Food in the Last Year: Never true    Ran Out of Food in the Last Year: Never true  Transportation Needs: No Transportation Needs (02/06/2024)   Epic    Lack of Transportation (Medical): No    Lack of Transportation (Non-Medical): No  Physical Activity: Sufficiently Active (02/06/2024)   Exercise Vital Sign    Days of Exercise per Week: 4 days    Minutes of Exercise per Session: 100 min  Stress: No Stress Concern Present (02/06/2024)   [employer] of Occupational Health - Occupational Stress Questionnaire    Feeling of Stress: Only a little  Social Connections: Socially Isolated (02/06/2024)   Social Connection and Isolation Panel    Frequency of Communication with Friends and Family: More than three times a week    Frequency of Social Gatherings with Friends and Family: Three times a week    Attends Religious Services: Patient declined    Active Member of Clubs or Organizations: No  Attends Banker Meetings: Not on file    Marital Status: Divorced  Depression (PHQ2-9): Low Risk (02/15/2024)   Depression (PHQ2-9)    PHQ-2 Score: 0  Alcohol Screen: Medium Risk (02/06/2024)   Alcohol Screen    Last Alcohol Screening Score (AUDIT): 8  Housing: Low Risk (02/06/2024)   Epic    Unable to Pay for Housing in the Last Year: No    Number of Times Moved in the Last Year: 0    Homeless in the Last Year: No  Utilities: Not on file  Health Literacy: Not on file    Family History  Problem Relation Age of Onset   Heart failure Mother    Heart attack Mother    Hypertension Father     Health Maintenance  Topic Date Due   FOOT EXAM  Never done   OPHTHALMOLOGY EXAM  Never done   HIV Screening  Never done   Diabetic kidney evaluation -  Urine ACR  Never done   Hepatitis C Screening  Never done   DTaP/Tdap/Td (1 - Tdap) Never done   Pneumococcal Vaccine: 50+ Years (1 of 2 - PCV) Never done   Hepatitis B Vaccines 19-59 Average Risk (1 of 3 - 19+ 3-dose series) Never done   Zoster Vaccines- Shingrix (1 of 2) Never done   Colonoscopy  Never done   Influenza Vaccine  07/10/2024 (Originally 11/11/2023)   HEMOGLOBIN A1C  08/19/2024   Diabetic kidney evaluation - eGFR measurement  05/16/2025   HPV VACCINES (No Doses Required) Completed   Meningococcal B Vaccine  Aged Out   COVID-19 Vaccine  Discontinued     ----------------------------------------------------------------------------------------------------------------------------------------------------------------------------------------------------------------- Physical Exam BP (!) 145/74   Pulse 86   Ht 6' (1.829 m)   Wt 247 lb (112 kg)   SpO2 100%   BMI 33.50 kg/m   Physical Exam Constitutional:      Appearance: Normal appearance.  Eyes:     General: No scleral icterus. Cardiovascular:     Rate and Rhythm: Normal rate and regular rhythm.  Pulmonary:     Effort: Pulmonary effort is normal.     Breath sounds: Normal breath sounds.  Musculoskeletal:     Cervical back: Neck supple.  Neurological:     Mental Status: He is alert.  Psychiatric:        Mood and Affect: Mood normal.        Behavior: Behavior normal.     ------------------------------------------------------------------------------------------------------------------------------------------------------------------------------------------------------------------- Assessment and Plan  Type 2 diabetes mellitus without complications (HCC) He has had hyperglycemia recently.  Continue metformin  at current strength.  He is adding tirzepatide  at 2.5 mg weekly after discussion today.  Will also add an additional 15 units of Toujeo  as we titrate to his appetite.  Provided with samples of freestyle libre to  closely monitor glucose.  Follow-up in 4 to 6 weeks.   Meds ordered this encounter  Medications   insulin  glargine, 1 Unit Dial, (TOUJEO  SOLOSTAR) 300 UNIT/ML Solostar Pen    Sig: Inject 15 Units into the skin daily.    Dispense:  4.5 mL    Refill:  0   Insulin  Pen Needle (PEN NEEDLES) 32G X 4 MM MISC    Sig: Use with insulin .    Dispense:  100 each    Refill:  0    Return in about 6 months (around 11/14/2024) for Type 2 Diabetes.        [1]  Allergies Allergen Reactions   Hydrochlorothiazide  Other (See Comments)  ED, muscle cramps, gout   Lisinopril  Cough   "

## 2024-08-17 ENCOUNTER — Ambulatory Visit: Admitting: Family Medicine

## 2024-11-16 ENCOUNTER — Ambulatory Visit: Admitting: Family Medicine
